# Patient Record
Sex: Female | Born: 1935 | Race: Black or African American | Hispanic: No | Smoking: Never smoker
Health system: Southern US, Community
[De-identification: ages and names within clinical notes are randomized; demographics above are authoritative.]

## PROBLEM LIST (undated history)

## (undated) DIAGNOSIS — T68XXXA Hypothermia, initial encounter: Secondary | ICD-10-CM

## (undated) DIAGNOSIS — R609 Edema, unspecified: Secondary | ICD-10-CM

## (undated) DIAGNOSIS — I10 Essential (primary) hypertension: Secondary | ICD-10-CM

## (undated) DIAGNOSIS — A0472 Enterocolitis due to Clostridium difficile, not specified as recurrent: Secondary | ICD-10-CM

## (undated) DIAGNOSIS — F0390 Unspecified dementia without behavioral disturbance: Secondary | ICD-10-CM

## (undated) HISTORY — PX: OTHER SURGICAL HISTORY: SHX169

---

## 2009-05-06 ENCOUNTER — Emergency Department: Payer: Self-pay | Admitting: Emergency Medicine

## 2011-08-06 LAB — CBC WITH DIFFERENTIAL/PLATELET
Basophil %: 0.3 %
Eosinophil #: 0.1 10*3/uL (ref 0.0–0.7)
Lymphocyte %: 10.2 %
MCH: 30 pg (ref 26.0–34.0)
MCV: 90 fL (ref 80–100)
Monocyte #: 1.4 x10 3/mm — ABNORMAL HIGH (ref 0.2–0.9)
Neutrophil #: 7.1 10*3/uL — ABNORMAL HIGH (ref 1.4–6.5)
Neutrophil %: 74.3 %
RDW: 13.4 % (ref 11.5–14.5)

## 2011-08-06 LAB — BASIC METABOLIC PANEL
Anion Gap: 10 (ref 7–16)
BUN: 24 mg/dL — ABNORMAL HIGH (ref 7–18)
Chloride: 95 mmol/L — ABNORMAL LOW (ref 98–107)
Creatinine: 1.26 mg/dL (ref 0.60–1.30)
EGFR (African American): 44 — ABNORMAL LOW
EGFR (Non-African Amer.): 38 — ABNORMAL LOW
Glucose: 83 mg/dL (ref 65–99)

## 2011-08-07 ENCOUNTER — Inpatient Hospital Stay: Payer: Self-pay | Admitting: Internal Medicine

## 2011-08-07 LAB — HEPATIC FUNCTION PANEL A (ARMC)
Bilirubin,Total: 0.3 mg/dL (ref 0.2–1.0)
SGPT (ALT): 20 U/L
Total Protein: 7.4 g/dL (ref 6.4–8.2)

## 2011-08-07 LAB — IRON AND TIBC
Iron Bind.Cap.(Total): 259 ug/dL (ref 250–450)
Iron Saturation: 20 %
Iron: 51 ug/dL (ref 50–170)

## 2011-08-07 LAB — URINALYSIS, COMPLETE
Bilirubin,UR: NEGATIVE
Blood: NEGATIVE
Leukocyte Esterase: NEGATIVE
Nitrite: NEGATIVE
Ph: 7 (ref 4.5–8.0)
Squamous Epithelial: <1
WBC UR: <1 /HPF (ref 0–5)

## 2011-08-07 LAB — FERRITIN: Ferritin (ARMC): 263 ng/mL (ref 8–388)

## 2011-08-07 LAB — TSH: Thyroid Stimulating Horm: 0.549 u[IU]/mL

## 2011-08-08 LAB — CBC WITH DIFFERENTIAL/PLATELET
Basophil #: 0 10*3/uL (ref 0.0–0.1)
Eosinophil %: 0.8 %
HCT: 30.1 % — ABNORMAL LOW (ref 35.0–47.0)
HGB: 10.6 g/dL — ABNORMAL LOW (ref 12.0–16.0)
Lymphocyte #: 1 10*3/uL (ref 1.0–3.6)
Lymphocyte %: 10.9 %
MCV: 88 fL (ref 80–100)
Monocyte %: 16.2 %
Neutrophil #: 6.8 10*3/uL — ABNORMAL HIGH (ref 1.4–6.5)
Neutrophil %: 71.8 %
RBC: 3.44 10*6/uL — ABNORMAL LOW (ref 3.80–5.20)
RDW: 13.7 % (ref 11.5–14.5)
WBC: 9.4 10*3/uL (ref 3.6–11.0)

## 2011-08-08 LAB — BASIC METABOLIC PANEL
Anion Gap: 9 (ref 7–16)
Calcium, Total: 9 mg/dL (ref 8.5–10.1)
Chloride: 92 mmol/L — ABNORMAL LOW (ref 98–107)
Co2: 26 mmol/L (ref 21–32)
EGFR (African American): 44 — ABNORMAL LOW
EGFR (Non-African Amer.): 38 — ABNORMAL LOW
Glucose: 85 mg/dL (ref 65–99)
Osmolality: 260 (ref 275–301)

## 2014-05-02 NOTE — H&P (Signed)
PATIENT NAME:  Connie Barr, Edan W MR#:  [id number] DATE OF BIRTH:  [birth year]  DATE OF ADMISSION:  08/07/2011  PRIMARY CARE PHYSICIAN: She has no local doctor. She sees somebody in [place].   CHIEF COMPLAINT: Increased peripheral edema, getting worse over the last few days, and some shortness of breath.   HISTORY OF PRESENT ILLNESS: Connie Barr is an 81 year old pleasant African American female. She was brought to the emergency department by her niece for evaluation of progressive increase in her lower extremity swelling. There was a question about increased shortness of breath that was reported; however, the patient denies for me. She states that her concern is about the leg swelling. She indicates that she has chronic leg swelling on and off, but it got worse. She denies any chest pain, no cough, no hemoptysis, no fever, and no abdominal pain. Evaluation in the emergency department revealed significant lower extremity edema along with finding of hyponatremia and anemia. The patient was admitted for further evaluation and management.   REVIEW OF SYSTEMS: CONSTITUTIONAL: Denies any fever. No chills. No fatigue. EYES: No blurring of vision. No double vision. ENT: No hearing impairment. No sore throat. No dysphagia. CARDIOVASCULAR: No chest pain. Questionable shortness of breath. No syncope. She has had progressive increase in leg edema for the last few days. RESPIRATORY: No chest pain. No hemoptysis. No cough. GASTROINTESTINAL: No abdominal pain. No vomiting. No diarrhea. GENITOURINARY: No dysuria. No frequency of urination. MUSCULOSKELETAL: No joint pain or swelling. No muscular pain or swelling. INTEGUMENTARY: No skin rash. No ulcers. NEUROLOGY: No focal weakness. No seizure activity. No headache. PSYCHIATRY: No anxiety. No depression. ENDOCRINE: No polyuria or polydipsia. No heat or cold intolerance.   PAST MEDICAL HISTORY: Reports hypertension and leg edema. This is the first presentation for this patient  to be admitted to this hospital and we have no records about her. She states that the last time she was hospitalized was many years ago and was for teeth extraction.  PAST SURGICAL HISTORY: Pelvic tumor that was benign, removed surgically in the 1960s. The patient does not have details about that.   FAMILY HISTORY: Both of her parents died at an old age. She does not have any specifics about that.   SOCIAL HABITS: Nonsmoker. No history of alcohol or drug abuse.   SOCIAL HISTORY: She is widowed, lives at home alone, and she has her niece who  takes care of her and looks after her.   ADMISSION MEDICATIONS: All that she knows is that she is on a fluid pill, a blue blood pressure pill, and aspirin 81 mg a day.   ALLERGIES: No known drug allergies, but reported that penicillin caused her to have some nausea and sometimes vomiting.   PHYSICAL EXAMINATION:   VITAL SIGNS: Blood pressure 204/91, respiratory rate 18, pulse 72, temperature 98.2, and pulse oximetry 100%. She is on oxygen now.   GENERAL APPEARANCE: Elderly female lying in bed in no acute distress.   HEAD AND NECK EXAMINATION: No pallor. No icterus. No cyanosis.  EARS, NOSE, AND THROAT: Hearing was normal. Nasal mucosa, lips, and tongue were normal.   EYES: Normal iris and conjunctivae, although conjunctivae is slightly pale. Pupils are about 3 to 4 mm. I could not detect reactivity to light with certainty.  NECK: Supple. Trachea at midline. No thyromegaly. No cervical lymphadenopathy. No masses.   HEART: Normal S1 and S2. No S3 or S4. No murmur. No gallop. No carotid bruits.   LUNGS: Normal breathing pattern without  use of accessory muscles. No rales. No wheezing.   ABDOMEN: Soft without tenderness. No hepatosplenomegaly. No masses. No hernias.   SKIN: No ulcers. No subcutaneous nodules.   EXTREMITIES: She has lower extremity edema, especially around the ankle and the dorsum of her feet. It is +3. No calf tenderness.    MUSCULOSKELETAL: No joint swelling. No clubbing.   NEUROLOGIC: Cranial nerves II through XII are intact. No focal motor deficit.   PSYCHIATRIC: The patient is alert and oriented to place and people. Her mood and affect were normal.   LABORATORY, DIAGNOSTIC AND RADIOLOGIC DATA: Chest x-ray showed heart size was normal. There is pulmonary vascular prominence with some supplization of pulmonary vessels. No consolidation. No effusion.   Bilateral lower extremity venous ultrasound was negative for deep vein thrombosis.   Serum glucose 83. B-type natriuretic peptide was elevated at 454. BUN 24, creatinine 1.2, sodium 128, and potassium 3.9. Estimated GFR 44. Liver function tests were normal. CBC showed white count of 9000, hemoglobin 10.3, hematocrit 30, and platelet count 232. MCV, MCH and MCHC were normal. D-dimer was more than 6.  Urinalysis was unremarkable.   ASSESSMENT:  1. Anasarca, in particular significant lower extremity edema. There also mild lymphedema as well.  2. Hyponatremia, most likely secondary to volume overload.  3. Normocytic, normochromic anemia.  4. Severe hypertension, uncontrolled.   PLAN: We will admit the patient to the medical floor and start intravenous diuresis using Lasix. Follow up on the sodium level and potassium as well. The potassium is in the low normal range. Therefore, I will give the patient some potassium supplementation since we are going to diurese her. I will add ACE inhibitor using lisinopril 10 mg a day for blood pressure control. We will obtain an echocardiogram to assess her left ventricular function and ejection fraction. We will do anemia work-up including iron TIBC, ferritin, B12, and folate. For deep vein thrombosis prophylaxis, I will place the patient on Lovenox. It will be a good idea at one-point to contact her pharmacy when it opens to have an idea about what medications she is taking. Regarding a Living Will, the patient states that she does  have a Living Will and she depends on her niece to be her caregiver, but it is unclear if she gave her legal power of attorney.   TIME SPENT EVALUATING THIS PATIENT: More than 55 minutes.  ____________________________ Carney Corners. Rudene Re, MD amd:slb D: 08/07/2011 06:30:25 ET T: 08/07/2011 09:39:17 ET JOB#: [id number]  cc: Carney Corners. Rudene Re, MD, <Dictator> Zollie Scale MD ELECTRONICALLY SIGNED 08/08/2011 22:34

## 2014-05-02 NOTE — Discharge Summary (Signed)
PATIENT NAME:  Connie Barr, Connie Barr MR#:  [id number] DATE OF BIRTH:  [birth year]  DATE OF ADMISSION:  08/07/2011 DATE OF DISCHARGE:  0726/2013  ADMITTING DIAGNOSIS: Lower extremity swelling.   DISCHARGE DIAGNOSES:  1. Lower extremity swelling likely due to combination of lymphedema as well as likely venous insufficiency. No evidence of congestive heart failure. Status post treatment with IV Lasix. Negative lower extremity Dopplers for deep vein thrombosis.  2. Hyponatremia, likely chronic in nature. Needs to have follow-up BMP as an outpatient  3. Hypertension.  4. History of pelvic tumor status post surgical resection.   PERTINENT LABORATORY, DIAGNOSTIC AND RADIOLOGIC DATA: Glucose 83. Basic metabolic panel 454. BUN 24, creatinine 1.26, sodium 128, potassium 3.9, chloride 95, and CO2 23. LFTs were normal. TSH 0.941. WBC 9.6, hemoglobin 10.3, and platelet count 232. D-dimer greater than 6.  Vitamin B12 level was elevated.   Bilateral lower extremity Doppler negative for deep venous thrombosis.   Chest x-ray showed possible underlying mild fibrosis.   CONSULTANTS: None.   HOSPITAL COURSE: Please refer to history and physical done by the admitting physician. The patient is an 81 year old pleasant African American female brought to the ED by her niece due to progressive increase in her lower extremity swelling. The patient otherwise reported that she was feeling fine. She was also noted to have some hyponatremia and anemia. We were asked to admit the patient. The patient had lower extremity Doppler's in the ED which were negative for deep vein thrombosis. There was no evidence of congestive heart failure. It is likely that she has chronic peripheral lymphedema as well as possible venous insufficiency. The patient was placed on IV Lasix with resolution of her swelling. She is doing much better and wants to go home. We will have PT evaluate the patient prior to discharge. If she does okay, discharge home.     DISCHARGE MEDICATIONS:  1. Hydrochlorothiazide 25 mg daily.  2. Aspirin 81 mg 1 tab p.o. daily. 3. Cosopt one drop to each affected eye twice a day. 4. Cetirizine 10 mg 1 tab p.o. daily.  5. The patient is given Lasix 40 mg p.r.n. for significant swelling.   HOME OXYGEN: None.   DIET: Regular.   ACTIVITY: As tolerated.   TIMEFRAME FOR FOLLOW UP: Followup in 1 to 2 weeks with [facility] primary care. Check a BMP at the time of visit to primary MD.   TIME SPENT: 35 minutes. ____________________________ Lacie ScottsShreyang H. Allena KatzPatel, MD shp:slb D: 08/08/2011 11:28:12 ET T: 08/08/2011 12:17:05 ET JOB#: [id number]  cc: Caellum Mancil H. Allena KatzPatel, MD, <Dictator>  Charise CarwinSHREYANG H Delainee Tramel MD ELECTRONICALLY SIGNED 08/12/2011 12:16

## 2014-06-01 ENCOUNTER — Inpatient Hospital Stay
Admission: EM | Admit: 2014-06-01 | Discharge: 2014-06-03 | DRG: 947 | Disposition: A | Discharge status: Skilled Nursing Facility | Payer: Medicare Other | Attending: Internal Medicine | Admitting: Internal Medicine

## 2014-06-01 ENCOUNTER — Encounter: Payer: Self-pay | Admitting: Emergency Medicine

## 2014-06-01 ENCOUNTER — Emergency Department: Payer: Medicare Other

## 2014-06-01 DIAGNOSIS — H409 Unspecified glaucoma: Secondary | ICD-10-CM | POA: Diagnosis present

## 2014-06-01 DIAGNOSIS — Z66 Do not resuscitate: Secondary | ICD-10-CM | POA: Diagnosis present

## 2014-06-01 DIAGNOSIS — N170 Acute kidney failure with tubular necrosis: Secondary | ICD-10-CM | POA: Diagnosis present

## 2014-06-01 DIAGNOSIS — R410 Disorientation, unspecified: Secondary | ICD-10-CM

## 2014-06-01 DIAGNOSIS — E039 Hypothyroidism, unspecified: Secondary | ICD-10-CM | POA: Diagnosis present

## 2014-06-01 DIAGNOSIS — F0390 Unspecified dementia without behavioral disturbance: Secondary | ICD-10-CM | POA: Diagnosis present

## 2014-06-01 DIAGNOSIS — T68XXXA Hypothermia, initial encounter: Secondary | ICD-10-CM

## 2014-06-01 DIAGNOSIS — R680 Hypothermia, not associated with low environmental temperature: Principal | ICD-10-CM | POA: Diagnosis present

## 2014-06-01 DIAGNOSIS — R4182 Altered mental status, unspecified: Secondary | ICD-10-CM | POA: Diagnosis not present

## 2014-06-01 DIAGNOSIS — I10 Essential (primary) hypertension: Secondary | ICD-10-CM | POA: Diagnosis present

## 2014-06-01 DIAGNOSIS — B349 Viral infection, unspecified: Secondary | ICD-10-CM | POA: Diagnosis present

## 2014-06-01 DIAGNOSIS — R601 Generalized edema: Secondary | ICD-10-CM

## 2014-06-01 HISTORY — DX: Edema, unspecified: R60.9

## 2014-06-01 HISTORY — DX: Essential (primary) hypertension: I10

## 2014-06-01 LAB — COMPREHENSIVE METABOLIC PANEL
ALT: 23 U/L (ref 14–54)
AST: 24 U/L (ref 15–41)
Albumin: 3.7 g/dL (ref 3.5–5.0)
Alkaline Phosphatase: 77 U/L (ref 38–126)
Anion gap: 6 (ref 5–15)
BILIRUBIN TOTAL: 0.3 mg/dL (ref 0.3–1.2)
BUN: 36 mg/dL — AB (ref 6–20)
CHLORIDE: 103 mmol/L (ref 101–111)
CO2: 26 mmol/L (ref 22–32)
Calcium: 9.3 mg/dL (ref 8.9–10.3)
Creatinine, Ser: 1.26 mg/dL — ABNORMAL HIGH (ref 0.44–1.00)
GFR calc non Af Amer: 36 mL/min — ABNORMAL LOW (ref >60)
GFR, EST AFRICAN AMERICAN: 42 mL/min — AB (ref >60)
Glucose, Bld: 79 mg/dL (ref 65–99)
Potassium: 4.2 mmol/L (ref 3.5–5.1)
Sodium: 135 mmol/L (ref 135–145)
Total Protein: 7.3 g/dL (ref 6.5–8.1)

## 2014-06-01 LAB — LACTIC ACID, PLASMA
Lactic Acid, Venous: 1.5 mmol/L (ref 0.5–2.0)
Lactic Acid, Venous: 0.6 mmol/L (ref 0.5–2.0)

## 2014-06-01 LAB — URINALYSIS COMPLETE WITH MICROSCOPIC (ARMC ONLY)
Bilirubin Urine: NEGATIVE
Glucose, UA: NEGATIVE mg/dL
Hgb urine dipstick: NEGATIVE
Ketones, ur: NEGATIVE mg/dL
Nitrite: NEGATIVE
Protein, ur: NEGATIVE mg/dL
RBC / HPF: NONE SEEN RBC/hpf (ref 0–5)
Specific Gravity, Urine: 1.01 (ref 1.005–1.030)
pH: 6 (ref 5.0–8.0)

## 2014-06-01 LAB — CBC
HCT: 31.2 % — ABNORMAL LOW (ref 35.0–47.0)
HEMATOCRIT: 31.9 % — AB (ref 35.0–47.0)
Hemoglobin: 10.4 g/dL — ABNORMAL LOW (ref 12.0–16.0)
Hemoglobin: 10.6 g/dL — ABNORMAL LOW (ref 12.0–16.0)
MCH: 30.5 pg (ref 26.0–34.0)
MCH: 30.6 pg (ref 26.0–34.0)
MCHC: 33.2 g/dL (ref 32.0–36.0)
MCHC: 33.3 g/dL (ref 32.0–36.0)
MCV: 92 fL (ref 80.0–100.0)
MCV: 91.9 fL (ref 80.0–100.0)
PLATELETS: 200 10*3/uL (ref 150–440)
Platelets: 219 10*3/uL (ref 150–440)
RBC: 3.39 MIL/uL — ABNORMAL LOW (ref 3.80–5.20)
RBC: 3.47 MIL/uL — ABNORMAL LOW (ref 3.80–5.20)
RDW: 15.4 % — ABNORMAL HIGH (ref 11.5–14.5)
RDW: 15.2 % — AB (ref 11.5–14.5)
WBC: 8 10*3/uL (ref 3.6–11.0)
WBC: 6.3 10*3/uL (ref 3.6–11.0)

## 2014-06-01 LAB — CREATININE, SERUM
Creatinine, Ser: 1.19 mg/dL — ABNORMAL HIGH (ref 0.44–1.00)
GFR calc Af Amer: 45 mL/min — ABNORMAL LOW (ref >60)
GFR calc non Af Amer: 39 mL/min — ABNORMAL LOW (ref >60)

## 2014-06-01 LAB — PROTIME-INR
INR: 1
Prothrombin Time: 13.4 seconds (ref 11.4–15.0)

## 2014-06-01 LAB — TROPONIN I: Troponin I: <0.03 ng/mL

## 2014-06-01 LAB — TSH: TSH: 1.714 u[IU]/mL (ref 0.350–4.500)

## 2014-06-01 LAB — BRAIN NATRIURETIC PEPTIDE: B Natriuretic Peptide: 111 pg/mL — ABNORMAL HIGH (ref 0.0–100.0)

## 2014-06-01 LAB — APTT: aPTT: 31 s (ref 24–36)

## 2014-06-01 MED ORDER — ACETAMINOPHEN 500 MG PO TABS: 500.0000 mg | ORAL_TABLET | Freq: Four times a day (QID) | ORAL | Status: DC | PRN

## 2014-06-01 MED ORDER — SODIUM CHLORIDE 0.9 % IV SOLN
INTRAVENOUS | Status: DC
  Administered 2014-06-02 (×3): via INTRAVENOUS

## 2014-06-01 MED ORDER — FUROSEMIDE 10 MG/ML IJ SOLN
20.0000 mg | Freq: Once | INTRAMUSCULAR | Status: AC
  Administered 2014-06-01: 20 mg via INTRAVENOUS

## 2014-06-01 MED ORDER — VANCOMYCIN HCL IN DEXTROSE 1-5 GM/200ML-% IV SOLN
INTRAVENOUS | Status: AC
  Administered 2014-06-01: 1000 mg via INTRAVENOUS
  Filled 2014-06-01: qty 200

## 2014-06-01 MED ORDER — PIPERACILLIN-TAZOBACTAM 3.375 G IVPB
INTRAVENOUS | Status: AC
  Filled 2014-06-01: qty 50

## 2014-06-01 MED ORDER — QUETIAPINE FUMARATE 25 MG PO TABS
25.0000 mg | ORAL_TABLET | Freq: Two times a day (BID) | ORAL | Status: DC
  Administered 2014-06-01 – 2014-06-03 (×4): 25 mg via ORAL
  Filled 2014-06-01 (×4): qty 1

## 2014-06-01 MED ORDER — PIPERACILLIN-TAZOBACTAM 3.375 G IVPB
3.3750 g | Freq: Once | INTRAVENOUS | Status: AC
  Administered 2014-06-01: 3.375 g via INTRAVENOUS

## 2014-06-01 MED ORDER — CEFTRIAXONE SODIUM IN DEXTROSE 20 MG/ML IV SOLN
1.0000 g | INTRAVENOUS | Status: DC
  Administered 2014-06-01 – 2014-06-02 (×2): 1 g via INTRAVENOUS
  Filled 2014-06-01 (×3): qty 50

## 2014-06-01 MED ORDER — ENOXAPARIN SODIUM 40 MG/0.4ML ~~LOC~~ SOLN
40.0000 mg | SUBCUTANEOUS | Status: DC
  Administered 2014-06-01 – 2014-06-02 (×2): 40 mg via SUBCUTANEOUS
  Filled 2014-06-01 (×2): qty 0.4

## 2014-06-01 MED ORDER — DORZOLAMIDE HCL-TIMOLOL MAL 2-0.5 % OP SOLN
1.0000 [drp] | Freq: Two times a day (BID) | OPHTHALMIC | Status: DC
  Administered 2014-06-01 – 2014-06-03 (×4): 1 [drp] via OPHTHALMIC
  Filled 2014-06-01: qty 10

## 2014-06-01 MED ORDER — FUROSEMIDE 10 MG/ML IJ SOLN
INTRAMUSCULAR | Status: AC
  Filled 2014-06-01: qty 4

## 2014-06-01 MED ORDER — VANCOMYCIN HCL IN DEXTROSE 1-5 GM/200ML-% IV SOLN
1000.0000 mg | Freq: Once | INTRAVENOUS | Status: AC
  Administered 2014-06-01: 1000 mg via INTRAVENOUS

## 2014-06-01 NOTE — ED Notes (Signed)
[facility] home via [facility], pt confused, anxious, family concerned about living status, VSS, no pain, NAD

## 2014-06-01 NOTE — ED Notes (Signed)
Bair Hugger applied

## 2014-06-01 NOTE — H&P (Signed)
[facility] [place] [facility]   PATIENT NAME: Connie Barr    MR#:  [id number]  DATE OF BIRTH:  11/14/1922  DATE OF ADMISSION:  06/01/2014  PRIMARY CARE PHYSICIAN: Arlyss Queen, MD   REQUESTING/REFERRING PHYSICIAN: Dr. Loleta Rose  CHIEF COMPLAINT:   Chief Complaint  Patient presents with  . Altered Mental Status    HISTORY OF PRESENT ILLNESS:  Connie Barr  is a 81 y.o. female with a known history of hypertension, lower extremity lymphedema, dementia comes in with altered mental status. Patient progressed the life alert EMS arrived temperature was 93.8 Fahrenheit when she arrived. According to the family she is been having sundowning and then confused lately. And locking herself in the bathroom. He thinks that changes are coming to the house and trying to straighten her and the heart had social posterior bathroom and locks herself up and today she called ambulance when EMS arrived and he was hypothermic. Her vitals are stable. According to the family she is been having cough for the past 3 days associated with green phlegm. No fever. Patient is going to be admitted for hypothermia for possible impending sepsis. According to the family she stays cold all the time and also in even somewhat of the heaters run some. Patient family is requesting placement secondary to her sundowning and the fact that she lives alone.  PAST MEDICAL HISTORY:   Past Medical History  Diagnosis Date  . Hypertension   . Peripheral edema     PAST SURGICAL HISTOIRY:   Past Surgical History  Procedure Laterality Date  . Pelvic tumor      removed in the 1960s    SOCIAL HISTORY:   History  Substance Use Topics  . Smoking status: Never Smoker   . Smokeless tobacco: Not on file  . Alcohol Use: No    FAMILY HISTORY:  History reviewed. No pertinent family history.  DRUG ALLERGIES:  No Known Allergies  REVIEW OF SYSTEMS:  CONSTITUTIONAL: No fever, fatigue or  weakness.  EYES: No blurred or double vision.  EARS, NOSE, AND THROAT: No tinnitus or ear pain.  RESPIRATORY: No cough, shortness of breath, wheezing or hemoptysis.  CARDIOVASCULAR: No chest pain, orthopnea, edema.  GASTROINTESTINAL: No nausea, vomiting, diarrhea or abdominal pain.  GENITOURINARY: No dysuria, hematuria.  ENDOCRINE: No polyuria, nocturia,  HEMATOLOGY: No anemia, easy bruising or bleeding SKIN: No rash or lesion. MUSCULOSKELETAL: No joint pain or arthritis.   NEUROLOGIC: No tingling, numbness, weakness.  PSYCHIATRY: No anxiety or depression.   MEDICATIONS AT HOME:   Prior to Admission medications   Medication Sig Start Date End Date Taking? Authorizing Provider  acetaminophen (TYLENOL) 500 MG tablet Take 500-1,000 mg by mouth every 6 (six) hours as needed for mild pain or headache.   Yes Historical Provider, MD  dorzolamide-timolol (COSOPT) 22.3-6.8 MG/ML ophthalmic solution Place 1 drop into both eyes 2 (two) times daily.   Yes Historical Provider, MD  enalapril (VASOTEC) 20 MG tablet Take 20 mg by mouth daily.   Yes Historical Provider, MD  Multiple Vitamins-Minerals (CENTRUM SILVER PO) Take 1 tablet by mouth daily.   Yes Historical Provider, MD  QUEtiapine (SEROQUEL) 25 MG tablet Take 50 mg by mouth 2 (two) times daily.   Yes Historical Provider, MD      VITAL SIGNS:  Blood pressure 165/80, pulse 67, temperature 93.8 F (34.3 C), temperature source Rectal, resp. rate 17, SpO2 99 %.  PHYSICAL EXAMINATION:  GENERAL:  81 y.o.-year-old patient lying  in the bed with no acute distress.  EYES: Pupils equal, round, reactive to light and accommodation. No scleral icterus. Extraocular muscles intact.  HEENT: Head atraumatic, normocephalic. Oropharynx and nasopharynx clear.  NECK:  Supple, no jugular venous distention. No thyroid enlargement, no tenderness.  LUNGS: Normal breath sounds bilaterally, no wheezing, rales,rhonchi or crepitation. No use of accessory muscles of  respiration.  CARDIOVASCULAR: S1, S2 normal. No murmurs, rubs, or gallops.  ABDOMEN: Soft, nontender, nondistended. Bowel sounds present. No organomegaly or mass.  EXTREMITIES: 3+ pitting edema up to the knees bilaterally. Patient does have chronic lymphedema. No tenderness no redness. NEUROLOGIC: Cranial nerves II through XII are intact. Muscle strength 5/5 in all extremities. Sensation intact. Gait not checked.  PSYCHIATRIC: The patient is alert and oriented x 3. Slightly confused SKIN: No obvious rash, lesion, or ulcer.   LABORATORY PANEL:   CBC  Recent Labs Lab 06/01/14 1445  WBC 8.0  HGB 10.4*  HCT 31.2*  PLT 219   ------------------------------------------------------------------------------------------------------------------  Chemistries   Recent Labs Lab 06/01/14 1445  NA 135  K 4.2  CL 103  CO2 26  GLUCOSE 79  BUN 36*  CREATININE 1.26*  CALCIUM 9.3  AST 24  ALT 23  ALKPHOS 77  BILITOT 0.3   ------------------------------------------------------------------------------------------------------------------  Cardiac Enzymes  Recent Labs Lab 06/01/14 1445  TROPONINI <0.03   ------------------------------------------------------------------------------------------------------------------  RADIOLOGY:  Ct Head Wo Contrast  06/01/2014   CLINICAL DATA:  Altered mental status  EXAM: CT HEAD WITHOUT CONTRAST  TECHNIQUE: Contiguous axial images were obtained from the base of the skull through the vertex without intravenous contrast.  COMPARISON:  05/06/2009  FINDINGS: There is no evidence of mass effect, midline shift, or extra-axial fluid collections. There is no evidence of a space-occupying lesion or intracranial hemorrhage. There is no evidence of a cortical-based area of acute infarction. There is generalized cerebral atrophy. There is periventricular white matter low attenuation likely secondary to microangiopathy.  The ventricles and sulci are appropriate for  the patient's age. The basal cisterns are patent.  Visualized portions of the orbits are unremarkable. The visualized portions of the paranasal sinuses and mastoid air cells are unremarkable. Cerebrovascular atherosclerotic calcifications are noted.  The osseous structures are unremarkable.  IMPRESSION: 1. No acute intracranial pathology. 2. Chronic microvascular disease and cerebral atrophy.   Electronically Signed   By: Elige KoHetal  Patel   On: 06/01/2014 15:55   Dg Chest Port 1 View  06/01/2014   CLINICAL DATA:  Altered mental status.  Chest pain.  EXAM: PORTABLE CHEST - 1 VIEW  COMPARISON:  08/06/2011  FINDINGS: Patient is rotated towards the left. There is mild linear airspace disease at the left lung base likely reflecting discoid atelectasis. There is no focal parenchymal opacity. There is no pleural effusion or pneumothorax. The heart mediastinum are stable.  There is severe osteoarthritis of the left glenohumeral joint.  IMPRESSION: No active disease.   Electronically Signed   By: Elige KoHetal  Patel   On: 06/01/2014 15:40    EKG:   Orders placed or performed during the hospital encounter of 06/01/14  . ED EKG  . ED EKG    IMPRESSION AND PLAN:   1 hypothermia in demented patient: Rule out sepsis: Continue patient on bair hugger.  check temperature to 1 hour, continue IV fluids. Follow blood cultures, urine cultures. Patient is a bank and Zosyn the emergency room. Chest x-ray was concerning for some atelectasis, she is having some cough and green phlegm so we will empirically  cover with antibiotics Rocephin 1 g IV daily. Check TSH level  Hypothyroidism.  #2 patient to confusion and sundowning: She lives alone family requesting placement, physical therapy evaluation and social worker evaluation.  3.The dementia ;pt has been started by primary doctor a week ago. Continue Seroquel 50 MG twice a day.  4. Mild acute renal failure likely ATN: continue IV fluids for today, hold enalapril  5. Altered  mental status likely secondary to underlying dementia rather than due to hypothermia. Patient CT head unremarkable. Glaucoma continue eyedrops. CODE STATUS DO NOT RESUSCITATE    All the records are reviewed and case discussed with ED provider. Management plans discussed with the patient, family and they are in agreement.  CODE STATUS:   DO NOT RESUSCITATE TOTAL TIME TAKING CARE OF THIS PATIENT: 55 minutes.    Katha HammingKONIDENA,Edie Darley M.D on 06/01/2014 at 6:14 PM  Between 7am to 6pm - Pager - [phone]  After 6pm go to [email or web address] - password [email or web address] [facility]  [facility]  Office  [phone]  CC: Primary care physician; Arlyss QueenSELVIDGE,WILLIAM M, MD

## 2014-06-01 NOTE — ED Provider Notes (Signed)
[facility] Emergency Department Provider Note  ____________________________________________  Time seen: Approximately 3:07 PM  I have reviewed the triage vital signs and the nursing notes.   HISTORY  Chief Complaint Altered Mental Status  Altered mental status versus chronic dementia limits the history, which was obtained primarily from past medical records and from family  HPI Durwin NoraMartha W Greulich is a 81 y.o. female with unknown medical history presents by EMS for altered mental status.  The patient is confused and unable to provide history but is in no acute distress.  The family is reportedly here but not available during my examination.  The patient lives at home apparently by herself.  The patient's past medical and social history was obtained from an admission H&P and discharge summary from this hospital in 2013.She was admitted at that time for peripheral edema anasarca and hyponatremia.  Past Medical History  Diagnosis Date  . Hypertension   . Peripheral edema    There are no active problems to display for this patient.   Past Surgical History  Procedure Laterality Date  . Pelvic tumor      removed in the 1960s    No current outpatient prescriptions on file.  Allergies Review of patient's allergies indicates not on file.  History reviewed. No pertinent family history.  Social History History  Substance Use Topics  . Smoking status: Never Smoker   . Smokeless tobacco: Not on file  . Alcohol Use: No   Review of Systems  A reliable review of systems is not able to be obtained due to the patient's confusion/altered mental status, during my evaluation she denied nausea, vomiting, chest pain, shortness of breath, abdominal pain, fever, and chills. ____________________________________________   PHYSICAL EXAM:  VITAL SIGNS: ED Triage Vitals  Enc Vitals Group     BP 06/01/14 1413 171/76 mmHg     Pulse Rate 06/01/14 1413 61     Resp 06/01/14  1413 16     Temp 06/01/14 1423 93.8 F (34.3 C)     Temp Source 06/01/14 1423 Rectal     SpO2 06/01/14 1413 100 %     Weight --      Height --      Head Cir --      Peak Flow --      Pain Score --      Pain Loc --      Pain Edu? --      Excl. in GC? --     Constitutional: Awake and alert, oriented to self and to the fact she is at the hospital.  Elderly but in no acute distress and is nontoxic appearing at this time Eyes: Conjunctivae are normal. PERRL. EOMI. Head: Atraumatic. Nose: No congestion/rhinnorhea. Mouth/Throat: Mucous membranes are moist.  Oropharynx non-erythematous. Neck: No stridor.  No cervical spine tenderness to palpation. Cardiovascular: Normal rate, regular rhythm. Grossly normal heart sounds.  Good peripheral circulation. Respiratory: Normal respiratory effort.  No retractions. Lungs CTAB. Gastrointestinal: Obese, soft and nontender. No distention. No abdominal bruits. No CVA tenderness. Musculoskeletal: 2+ bilateral lower extremity pitting edema.  Appropriately warm, no evidence of cellulitis.  No joint effusions. Neurologic:  Normal speech and language. No gross focal neurologic deficits are appreciated. Speech is normal.  Skin:  Skin is warm, dry and intact. No rash noted. Psychiatric: Speech and behavior are normal, oriented x 2  ____________________________________________   LABS (all labs ordered are listed, but only abnormal results are displayed)  Labs Reviewed  CBC -  Abnormal; Notable for the following:    RBC 3.39 (*)    Hemoglobin 10.4 (*)    HCT 31.2 (*)    RDW 15.4 (*)    All other components within normal limits  COMPREHENSIVE METABOLIC PANEL - Abnormal; Notable for the following:    BUN 36 (*)    Creatinine, Ser 1.26 (*)    GFR calc non Af Amer 36 (*)    GFR calc Af Amer 42 (*)    All other components within normal limits  BRAIN NATRIURETIC PEPTIDE - Abnormal; Notable for the following:    B Natriuretic Peptide 111.0 (*)    All other  components within normal limits  URINALYSIS COMPLETEWITH MICROSCOPIC ([facility])  - Abnormal; Notable for the following:    Color, Urine YELLOW (*)    APPearance CLOUDY (*)    Leukocytes, UA TRACE (*)    Bacteria, UA MANY (*)    Squamous Epithelial / LPF 6-30 (*)    All other components within normal limits  CULTURE, BLOOD (ROUTINE X 2)  CULTURE, BLOOD (ROUTINE X 2)  URINE CULTURE  APTT  PROTIME-INR  LACTIC ACID, PLASMA  TROPONIN I  LACTIC ACID, PLASMA   Urinalysis unremarkable ____________________________________________  EKG  ED ECG REPORT I, Gao Mitnick, the attending physician, personally viewed and interpreted this ECG.   Date: 06/01/2014  EKG Time: 14:24  Rate: 61  Rhythm: normal sinus rhythm, 1st degree AV block  Axis: Normal  Intervals:right bundle branch block  ST&T Change: Non-specific ST segment / T-wave changes, but no evidence of acute ischemia.  ____________________________________________  RADIOLOGY  Ct Head Wo Contrast  06/01/2014   CLINICAL DATA:  Altered mental status  EXAM: CT HEAD WITHOUT CONTRAST  TECHNIQUE: Contiguous axial images were obtained from the base of the skull through the vertex without intravenous contrast.  COMPARISON:  05/06/2009  FINDINGS: There is no evidence of mass effect, midline shift, or extra-axial fluid collections. There is no evidence of a space-occupying lesion or intracranial hemorrhage. There is no evidence of a cortical-based area of acute infarction. There is generalized cerebral atrophy. There is periventricular white matter low attenuation likely secondary to microangiopathy.  The ventricles and sulci are appropriate for the patient's age. The basal cisterns are patent.  Visualized portions of the orbits are unremarkable. The visualized portions of the paranasal sinuses and mastoid air cells are unremarkable. Cerebrovascular atherosclerotic calcifications are noted.  The osseous structures are unremarkable.  IMPRESSION: 1. No  acute intracranial pathology. 2. Chronic microvascular disease and cerebral atrophy.   Electronically Signed   By: Elige Ko   On: 06/01/2014 15:55   Dg Chest Port 1 View  06/01/2014   CLINICAL DATA:  Altered mental status.  Chest pain.  EXAM: PORTABLE CHEST - 1 VIEW  COMPARISON:  08/06/2011  FINDINGS: Patient is rotated towards the left. There is mild linear airspace disease at the left lung base likely reflecting discoid atelectasis. There is no focal parenchymal opacity. There is no pleural effusion or pneumothorax. The heart mediastinum are stable.  There is severe osteoarthritis of the left glenohumeral joint.  IMPRESSION: No active disease.   Electronically Signed   By: Elige Ko   On: 06/01/2014 15:40    ____________________________________________    INITIAL IMPRESSION / ASSESSMENT AND PLAN / ED COURSE  Pertinent labs & imaging results that were available during my care of the patient were reviewed by me and considered in my medical decision making (see chart for details).  3:26 PM  The patient's initial vitals are notable for hypothermia at about 93.  A bair hugger was started and a septic workup initiated.  So far the CBC, UA, and metabolic panel are unremarkable.  I am awaiting a lactic acid, troponin, BNP, CT head, and chest x-ray.  She has significant lower extremity edema but it is unknown if this is chronic for her.  She appears to be more awake at this time after being on the warmer.  I will continue to evaluate.  ----------------------------------------- 4:46 PM on 06/01/2014 -----------------------------------------  The patient's nieces are now present.  They report that she has had waxing and waning mental status recently but is also demonstrated a slow decline over a period of time (delirium on top of developing dementia?).  Her lower extremity edema is quite bad, similar to her presentation 2013.  Her altered mental status was significantly worse this morning when  she was hypothermic with no apparent reason.  Given her age, altered mental status, hypothermia, and anasarca, believe she would benefit from inpatient admission for a sepsis rule out an empiric antibiotics until the blood cultures come back negative.  I am also giving her Lasix 20 mg IV for her anasarca, which helped significantly in 2013 for her anasarca admission at that time.   She will likely also benefit from an inpatient PT/OT consultation and may eventually need placement and a skilled nursing facility based on their evaluation.  I discussed this plan with the hospitalist in person and they agree with the plan.    ____________________________________________  FINAL CLINICAL IMPRESSION(S) / ED DIAGNOSES  Final diagnoses:  Delirium  Hypothermia, initial encounter  Anasarca     Loleta Roseory Boneta Standre, MD 06/01/14 1654

## 2014-06-02 NOTE — Care Management (Signed)
Patient resides at home alone.  She has meals on Wheels and Life alert.  Her two nieces Blenda BridegroomLinda Smith- [phone] and Theresia LoLena McGhee [phone] are patient's HCPOA.  Will bring documents.  Patient has dementia and it has been progressing to the point that patient "probably should not be living alone."  She requires the assistance of her nieces for bathing and meals that can be put in the microwave.    She presented with altered mental status and hypothermia requiring bear hugger.  Sodium was low.   Physical therapy has recommended SNF and patient's nieces are in agreement.  Facility preference is  [facility].  CSW is aware

## 2014-06-02 NOTE — Evaluation (Signed)
Physical Therapy Evaluation Patient Details Name: Durwin NoraMartha W Segal MRN: [id number] DOB: 11-20-1922 Today's Date: 06/02/2014   History of Present Illness  81 yo female with onset of  hypothermia due to sepsis emerging, wiht weakness and dramatic LE edema developing.  CT negative but did note chronic cerebral atrophy, mild acute renal failure.  PMHx:  HTN, lymphadema, dementia.  Clinical Impression  Pt was seen for evaluation of mobility and anticipate her destination to be SNF.  Has a great start to PT being up in chair and inserviced nursing to assist back to bed.    Follow Up Recommendations SNF    Equipment Recommendations  None recommended by PT    Recommendations for Other Services       Precautions / Restrictions Precautions Precautions: Fall Precaution Comments: telemetry Restrictions Weight Bearing Restrictions: No      Mobility  Bed Mobility Overal bed mobility: Needs Assistance;+2 for physical assistance;+ 2 for safety/equipment Bed Mobility: Supine to Sit     Supine to sit: Mod assist;+2 for physical assistance;+2 for safety/equipment;HOB elevated     General bed mobility comments: cues and assist for hips and trunk  Transfers Overall transfer level: Needs assistance Equipment used: Rolling walker (2 wheeled);2 person hand held assist Transfers: Sit to/from [employer]Stand;Stand Pivot Transfers Sit to Stand: Mod assist;+2 physical assistance;+2 safety/equipment Stand pivot transfers: Mod assist;+2 physical assistance;+2 safety/equipment          Ambulation/Gait             General Gait Details: sidestep to chair only  Stairs            Wheelchair Mobility    Modified Rankin (Stroke Patients Only)       Balance Overall balance assessment: Needs assistance Sitting-balance support: Feet supported Sitting balance-Leahy Scale: Poor   Postural control: Posterior lean Standing balance support: Bilateral upper extremity supported Standing  balance-Leahy Scale: Poor                               Pertinent Vitals/Pain Pain Assessment: No/denies pain    Home Living Family/patient expects to be discharged to:: Skilled nursing facility Living Arrangements: Alone                    Prior Function Level of Independence: Independent with assistive device(s)         Comments: Has been assisted by her family     Hand Dominance        Extremity/Trunk Assessment   Upper Extremity Assessment: Generalized weakness           Lower Extremity Assessment: Generalized weakness      Cervical / Trunk Assessment: Kyphotic  Communication   Communication: Expressive difficulties;Other (comment) (slightly slurred speech)  Cognition Arousal/Alertness: Lethargic Behavior During Therapy: WFL for tasks assessed/performed Overall Cognitive Status: History of cognitive impairments - at baseline       Memory: Decreased short-term memory;Decreased recall of precautions              General Comments General comments (skin integrity, edema, etc.): LE edema is substantial and elevated legs in chair once up with compression devices on by MD    Exercises        Assessment/Plan    PT Assessment Patient needs continued PT services  PT Diagnosis Difficulty walking;Generalized weakness   PT Problem List Decreased strength;Decreased range of motion;Decreased activity tolerance;Decreased balance;Decreased mobility;Decreased cognition;Decreased coordination;Decreased knowledge of use of  DME;Decreased safety awareness;Cardiopulmonary status limiting activity;Decreased knowledge of precautions;Obesity;Pain  PT Treatment Interventions DME instruction;Gait training;Functional mobility training;Therapeutic activities;Therapeutic exercise;Balance training;Neuromuscular re-education;Patient/family education;Cognitive remediation   PT Goals (Current goals can be found in the Care Plan section) Acute Rehab PT  Goals Patient Stated Goal: none stated PT Goal Formulation: With patient Time For Goal Achievement: 06/16/14 Potential to Achieve Goals: Good    Frequency Min 2X/week   Barriers to discharge Inaccessible home environment;Decreased caregiver support      Co-evaluation               End of Session Equipment Utilized During Treatment: Gait belt Activity Tolerance: Patient tolerated treatment well;Patient limited by fatigue;Patient limited by lethargy;Other (comment) (wakness and LE edema) Patient left: in chair;with call bell/phone within reach;with chair alarm set;with nursing/sitter in room Nurse Communication: Mobility status         Time: 1132-1203 PT Time Calculation (min) (ACUTE ONLY): 31 min   Charges:   PT Evaluation $Initial PT Evaluation Tier I: 1 Procedure PT Treatments $Therapeutic Activity: 8-22 mins   PT G Codes:        Ivar DrapeStout, Colette Dicamillo E 06/02/2014, 1:35 PM   Samul Dadauth Nashaun Hillmer, PT MS Acute Rehab Dept. Number: [facility] [phone] and [facility] [phone]

## 2014-06-02 NOTE — Progress Notes (Signed)
[facility] [place] [facility]   PATIENT NAME: Connie ShepherdMartha Barr    MR#:  [id number]  DATE OF BIRTH:  03/30/1922  SUBJECTIVE:  CHIEF COMPLAINT:   Patient is more awake and alert but very hard of hearing. Feeling weak. No other complaints no family members at bedside  REVIEW OF SYSTEMS:  CONSTITUTIONAL: No fever, fatigue ,  reportingweakness.  EYES: No blurred or double vision.  EARS, NOSE, AND THROAT: No tinnitus or ear pain.  RESPIRATORY: No cough, shortness of breath, wheezing or hemoptysis.  CARDIOVASCULAR: No chest pain, orthopnea, edema.  GASTROINTESTINAL: No nausea, vomiting, diarrhea or abdominal pain.  GENITOURINARY: No dysuria, hematuria.  ENDOCRINE: No polyuria, nocturia,  HEMATOLOGY: No anemia, easy bruising or bleeding SKIN: No rash or lesion. MUSCULOSKELETAL: No joint pain or arthritis.   NEUROLOGIC: No tingling, numbness, weakness.  PSYCHIATRY: No anxiety or depression.   DRUG ALLERGIES:  No Known Allergies  VITALS:  Blood pressure 152/65, pulse 82, temperature 98.4 F (36.9 C), temperature source Oral, resp. rate 18, height 5\' 3"  (1.6 m), weight 85.276 kg (188 lb), SpO2 100 %.  PHYSICAL EXAMINATION:  GENERAL:  81 y.o.-year-old patient lying in the bed with no acute distress.  EYES: Pupils equal, round, reactive to light and accommodation. No scleral icterus. Extraocular muscles intact.  HEENT: Head atraumatic, normocephalic. Oropharynx and nasopharynx clear.  Hard of hearing NECK:  Supple, no jugular venous distention. No thyroid enlargement, no tenderness.  LUNGS: Normal breath sounds bilaterally, no wheezing, rales,rhonchi or crepitation. No use of accessory muscles of respiration.  CARDIOVASCULAR: S1, S2 normal. No murmurs, rubs, or gallops.  ABDOMEN: Soft, nontender, nondistended. Bowel sounds present. No organomegaly or mass.  EXTREMITIES: No pedal edema, cyanosis, or clubbing.  NEUROLOGIC: Cranial nerves II through XII are intact.  Muscle strength 5/5 in all extremities. Sensation intact. Gait not checked.  PSYCHIATRIC: The patient is alert and oriented x 3.  SKIN: No obvious rash, lesion, or ulcer.    LABORATORY PANEL:   CBC  Recent Labs Lab 06/01/14 2118  WBC 6.3  HGB 10.6*  HCT 31.9*  PLT 200   ------------------------------------------------------------------------------------------------------------------  Chemistries   Recent Labs Lab 06/01/14 1445 06/01/14 2118  NA 135  --   K 4.2  --   CL 103  --   CO2 26  --   GLUCOSE 79  --   BUN 36*  --   CREATININE 1.26* 1.19*  CALCIUM 9.3  --   AST 24  --   ALT 23  --   ALKPHOS 77  --   BILITOT 0.3  --    ------------------------------------------------------------------------------------------------------------------  Cardiac Enzymes  Recent Labs Lab 06/01/14 1445  TROPONINI <0.03   ------------------------------------------------------------------------------------------------------------------  RADIOLOGY:  Ct Head Wo Contrast  06/01/2014   CLINICAL DATA:  Altered mental status  EXAM: CT HEAD WITHOUT CONTRAST  TECHNIQUE: Contiguous axial images were obtained from the base of the skull through the vertex without intravenous contrast.  COMPARISON:  05/06/2009  FINDINGS: There is no evidence of mass effect, midline shift, or extra-axial fluid collections. There is no evidence of a space-occupying lesion or intracranial hemorrhage. There is no evidence of a cortical-based area of acute infarction. There is generalized cerebral atrophy. There is periventricular white matter low attenuation likely secondary to microangiopathy.  The ventricles and sulci are appropriate for the patient's age. The basal cisterns are patent.  Visualized portions of the orbits are unremarkable. The visualized portions of the paranasal sinuses and mastoid air cells are  unremarkable. Cerebrovascular atherosclerotic calcifications are noted.  The osseous structures are  unremarkable.  IMPRESSION: 1. No acute intracranial pathology. 2. Chronic microvascular disease and cerebral atrophy.   Electronically Signed   By: Elige KoHetal  Patel   On: 06/01/2014 15:55   Dg Chest Port 1 View  06/01/2014   CLINICAL DATA:  Altered mental status.  Chest pain.  EXAM: PORTABLE CHEST - 1 VIEW  COMPARISON:  08/06/2011  FINDINGS: Patient is rotated towards the left. There is mild linear airspace disease at the left lung base likely reflecting discoid atelectasis. There is no focal parenchymal opacity. There is no pleural effusion or pneumothorax. The heart mediastinum are stable.  There is severe osteoarthritis of the left glenohumeral joint.  IMPRESSION: No active disease.   Electronically Signed   By: Elige KoHetal  Patel   On: 06/01/2014 15:40    EKG:   Orders placed or performed during the hospital encounter of 06/01/14  . ED EKG  . ED EKG  . EKG 12-Lead  . EKG 12-Lead    ASSESSMENT AND PLAN:   * hypothermia in demented patient: Rule out sepsis: Improved with bair hugger.  continue IV fluids. Follow blood cultures, urine cultures. Patient is started  on empiric antibiotics IV vancomycin and Zosyn in the emergency department. Chest x-ray was concerning for some atelectasis, she is having some cough and green phlegm so we will empirically cover with antibiotics Rocephin 1 g IV daily.  *Hypothyroidism.  Normal TSH level    * dementia ;. Continue Seroquel 50 MG twice a day started by primary care physician a week ago  * Mild acute renal failure likely ATN: Clinically improving, check BMP in a.m., continue IV fluids for today, hold enalapril  *. Altered mental status likely secondary to underlying dementia rather than due to hypothermia. Patient CT head unremarkable.  *Glaucoma continue eyedrops.   * Generalized weakness -She lives alone family requesting placement, physical therapy evaluation and social worker evaluation.  CODE STATUS DO NOT RESUSCITATE     All the records are  reviewed and case discussed with Care Management/Social Workerr. Management plans discussed with the patient, family and they are in agreement.  CODE STATUS: DO NOT RESUSCITATE  TOTAL TIME TAKING CARE OF THIS PATIENT: 35 minutes.   POSSIBLE D/C IN 1-2 DAYS, DEPENDING ON CLINICAL CONDITION.   Ramonita LabGouru, Shayden Gingrich M.D on 06/02/2014 at 3:05 PM  Between 7am to 6pm - Pager - [phone] After 6pm go to [email or web address] - password [email or web address] [facility]  [facility]  Office  [phone]  CC: Primary care physician; Arlyss QueenSELVIDGE,WILLIAM M, MD

## 2014-06-02 NOTE — Clinical Social Work Note (Signed)
Clinical Social Work Assessment  Patient Details  Name: Connie Barr MRN: [id number] Date of Birth: [birth year]  Date of referral:  06/02/14               Reason for consult:  Facility Placement                Permission sought to share information with:  Facility Medical sales representativeContact Representative, Family Supports Permission granted to share information::  Yes, Verbal Permission Granted (Pt with AMS on admission. Pt's surrogate decision makers (nieces) granted verbal permission.)  Name::        Agency::     Relationship::     Contact Information:     Housing/Transportation Living arrangements for the past 2 months:  Single Family Home Source of Information:  Other (Comment Required) (Niece/Connie Barr (next of kin)) Patient Interpreter Needed:  None Criminal Activity/Legal Involvement Pertinent to Current Situation/Hospitalization:  No - Comment as needed Significant Relationships:  Other(Comment) (nieces: Connie Barr and Connie Barr) Lives with:  Self Do you feel safe going back to the place where you live?    Need for family participation in patient care:  Yes (Comment)  Care giving concerns:     Office managerocial Worker assessment / plan:  CSW spoke with pt's niece re: PT recommendation for SNF. Pt not oriented to situation. Pt's niece, Connie Barr, reports pt lives alone and has been to SNF [facility] in past. CSW reviewed placement process and answered questions. Pt's niece reports agreeable to [facility] SN[facility] search and identifies no preference. SNF search initiated. Will f/u with offers.  Employment status:  Retired Database administratornsurance information:  Managed Medicare PT Recommendations:  Skilled Nursing Facility Information / Referral to community resources:  Skilled Nursing Facility  Patient/Family's Response to care:  Pt's niece reports agreeable to above plan.  Patient/Family's Understanding of and Emotional Response to Diagnosis, Current Treatment, and Prognosis:   Emotional Assessment Appearance:     Attitude/Demeanor/Rapport:    Affect (typically observed):    Orientation:  Oriented to Self Alcohol / Substance use:  Not Applicable Psych involvement (Current and /or in the community):  No (Comment)  Discharge Needs  Concerns to be addressed:    Readmission within the last 30 days:  No Current discharge risk:  None Barriers to Discharge:  No Barriers Identified  Connie BurnsJosie Lundyn Barr, MSW, LCSW [phone]

## 2014-06-03 LAB — BASIC METABOLIC PANEL
Anion gap: 5 (ref 5–15)
BUN: 30 mg/dL — ABNORMAL HIGH (ref 6–20)
CO2: 26 mmol/L (ref 22–32)
Calcium: 8.7 mg/dL — ABNORMAL LOW (ref 8.9–10.3)
Chloride: 107 mmol/L (ref 101–111)
Creatinine, Ser: 1.19 mg/dL — ABNORMAL HIGH (ref 0.44–1.00)
GFR calc Af Amer: 45 mL/min — ABNORMAL LOW (ref >60)
GFR calc non Af Amer: 39 mL/min — ABNORMAL LOW (ref >60)
GLUCOSE: 68 mg/dL (ref 65–99)
POTASSIUM: 3.8 mmol/L (ref 3.5–5.1)
Sodium: 138 mmol/L (ref 135–145)

## 2014-06-03 LAB — CBC
HCT: 28.4 % — ABNORMAL LOW (ref 35.0–47.0)
HEMOGLOBIN: 9.4 g/dL — AB (ref 12.0–16.0)
MCH: 30.7 pg (ref 26.0–34.0)
MCHC: 33.1 g/dL (ref 32.0–36.0)
MCV: 92.7 fL (ref 80.0–100.0)
PLATELETS: 168 10*3/uL (ref 150–440)
RBC: 3.07 MIL/uL — ABNORMAL LOW (ref 3.80–5.20)
RDW: 15 % — ABNORMAL HIGH (ref 11.5–14.5)
WBC: 7.4 10*3/uL (ref 3.6–11.0)

## 2014-06-03 MED ORDER — METOPROLOL TARTRATE 25 MG PO TABS: 25.0000 mg | ORAL_TABLET | Freq: Two times a day (BID) | ORAL | Status: DC

## 2014-06-03 MED ORDER — HYDRALAZINE HCL 25 MG PO TABS
25.0000 mg | ORAL_TABLET | Freq: Once | ORAL | Status: AC
  Administered 2014-06-03: 25 mg via ORAL
  Filled 2014-06-03: qty 1

## 2014-06-03 MED ORDER — METOPROLOL TARTRATE 25 MG PO TABS
25.0000 mg | ORAL_TABLET | Freq: Two times a day (BID) | ORAL | Status: DC
  Administered 2014-06-03: 25 mg via ORAL
  Filled 2014-06-03: qty 1

## 2014-06-03 NOTE — Discharge Summary (Signed)
[facility] - [place] at [facility]   PATIENT NAME: Connie Barr    MR#:  [id number]  DATE OF BIRTH:  04-Oct-1922  DATE OF ADMISSION:  06/01/2014 ADMITTING PHYSICIAN: Katha Hamming, MD  DATE OF DISCHARGE: 06/03/2014  PRIMARY CARE PHYSICIAN: Arlyss Queen, MD    ADMISSION DIAGNOSIS:  Anasarca [R60.1] Delirium [R41.0] Hypothermia, initial encounter [T68.XXXA]  DISCHARGE DIAGNOSIS:  Active Problems:   Hypothermia, resolved Acute kidney injury Acute viral syndrome      SECONDARY DIAGNOSIS:   Past Medical History  Diagnosis Date  . Hypertension   . Peripheral edema     HOSPITAL COURSE:  Brief history and physical- patient is a 81 year old female brought into the ED with a chief complaint of altered mental status. Please review history and physical for complete details. Patient's initial temperature was at 93.74F in the ED. According to the family patient has been coughing for 3 days prior to the admission with greenish phlegm.According to the family she stays cold all the time and also in even somewhat of the heaters run some. Patient family is requesting placement secondary to her sundowning and the fact that she lives alone.  Hospital course  Currently patient seems to be at her baseline  * hypothermia in demented patient: Probably secondary to acute viral syndrome: Improved with bair hugger. Blood cultures and urine cultures are with no growth so far. Clinical situation improved with IV fluids. We will discontinue empiric IV antibiotics which were started in the ED.    *Hypothyroidism. Normal TSH level   * dementia ;. Continue Seroquel 50 MG twice a day started by primary care physician a week ago  * Mild acute renal failure likely ATN: Clinically better with IV fluids. creatinine is at 1.19 today  *. Altered mental status likely secondary to acute viral syndrome and underlying dementia . CT head is unremarkable  *Glaucoma  continue eyedrops.   * Generalized weakness -She lives alone family requesting placement, PT is recommending skilled nursing care. Patient will be transferred to skilled nursing care today  DISCHARGE CONDITIONS:  Satisfactory  CONSULTS OBTAINED:  Treatment Team:  Ramonita Lab, MD   PROCEDURES none  DRUG ALLERGIES:  No Known Allergies  DISCHARGE MEDICATIONS:   Current Discharge Medication List    START taking these medications   Details  metoprolol tartrate (LOPRESSOR) 25 MG tablet Take 1 tablet (25 mg total) by mouth 2 (two) times daily. Qty: 60 tablet, Refills: 0      CONTINUE these medications which have NOT CHANGED   Details  acetaminophen (TYLENOL) 500 MG tablet Take 500-1,000 mg by mouth every 6 (six) hours as needed for mild pain or headache.    dorzolamide-timolol (COSOPT) 22.3-6.8 MG/ML ophthalmic solution Place 1 drop into both eyes 2 (two) times daily.    enalapril (VASOTEC) 20 MG tablet Take 20 mg by mouth daily.    Multiple Vitamins-Minerals (CENTRUM SILVER PO) Take 1 tablet by mouth daily.    QUEtiapine (SEROQUEL) 25 MG tablet Take 50 mg by mouth 2 (two) times daily.         DISCHARGE INSTRUCTIONS:   Follow-up with primary care physician in a week at the skilled nursing care  DIET:  Low-salt  DISCHARGE CONDITION:  Satisfactory  ACTIVITY:  As tolerated at, as recommended by physical therapy OXYGEN:  Home Oxygen: No need     DISCHARGE LOCATION:  Skilled nursing care  If you experience worsening of your admission symptoms, develop shortness of breath, life threatening emergency,  suicidal or homicidal thoughts you must seek medical attention immediately by calling 911 or calling your MD immediately  if symptoms less severe.  You Must read complete instructions/literature along with all the possible adverse reactions/side effects for all the Medicines you take and that have been prescribed to you. Take any new Medicines after you have  completely understood and accpet all the possible adverse reactions/side effects.   Please note  You were cared for by a hospitalist during your hospital stay. If you have any questions about your discharge medications or the care you received while you were in the hospital after you are discharged, you can call the unit and asked to speak with the hospitalist on call if the hospitalist that took care of you is not available. Once you are discharged, your primary care physician will handle any further medical issues. Please note that NO REFILLS for any discharge medications will be authorized once you are discharged, as it is imperative that you return to your primary care physician (or establish a relationship with a primary care physician if you do not have one) for your aftercare needs so that they can reassess your need for medications and monitor your lab values.     Today  Chief Complaint  Patient presents with  . Altered Mental Status   Patient is pleasantly confused which is probably her baseline  Review of systems Unobtainable in view of Alzheimer's dementia  VITAL SIGNS:  Blood pressure 165/123, pulse 57, temperature 97.4 F (36.3 C), temperature source Oral, resp. rate 18, height 5\' 3"  (1.6 m), weight 85.276 kg (188 lb), SpO2 100 %.  I/O:   Intake/Output Summary (Last 24 hours) at 06/03/14 1228 Last data filed at 06/03/14 0830  Gross per 24 hour  Intake 1513.33 ml  Output      0 ml  Net 1513.33 ml    PHYSICAL EXAMINATION:  GENERAL:  81 y.o.-year-old patient lying in the bed with no acute distress.  EYES: Pupils equal, round, reactive to light and accommodation. No scleral icterus. Extraocular muscles intact.  HEENT: Head atraumatic, normocephalic. Oropharynx and nasopharynx clear.  NECK:  Supple, no jugular venous distention. No thyroid enlargement, no tenderness.  LUNGS: Normal breath sounds bilaterally, no wheezing, rales,rhonchi or crepitation. No use of accessory  muscles of respiration.  CARDIOVASCULAR: S1, S2 normal. No murmurs, rubs, or gallops.  ABDOMEN: Soft, non-tender, non-distended. Bowel sounds present. No organomegaly or mass.  EXTREMITIES: No pedal edema, cyanosis, or clubbing.  NEUROLOGIC: Cranial nerves II through XII are intact. Muscle strength 5/5 in all extremities. Sensation intact. Gait not checked.  PSYCHIATRIC: The patient is alert and oriented x 3.  SKIN: No obvious rash, lesion, or ulcer.   DATA REVIEW:   CBC  Recent Labs Lab 06/03/14 0418  WBC 7.4  HGB 9.4*  HCT 28.4*  PLT 168    Chemistries   Recent Labs Lab 06/01/14 1445  06/03/14 0418  NA 135  --  138  K 4.2  --  3.8  CL 103  --  107  CO2 26  --  26  GLUCOSE 79  --  68  BUN 36*  --  30*  CREATININE 1.26*  < > 1.19*  CALCIUM 9.3  --  8.7*  AST 24  --   --   ALT 23  --   --   ALKPHOS 77  --   --   BILITOT 0.3  --   --   < > = values in  this interval not displayed.  Cardiac Enzymes  Recent Labs Lab 06/01/14 1445  TROPONINI <0.03    Microbiology Results  Results for orders placed or performed during the hospital encounter of 06/01/14  Urine culture     Status: None (Preliminary result)   Collection Time: 06/01/14  2:45 PM  Result Value Ref Range Status   Specimen Description Urine  Final   Special Requests Normal  Final   Culture NO GROWTH < 24 HOURS  Final   Report Status PENDING  Incomplete  Culture, blood (routine x 2)     Status: None (Preliminary result)   Collection Time: 06/01/14  3:40 PM  Result Value Ref Range Status   Specimen Description BLOOD  Final   Special Requests NONE  Final   Culture NO GROWTH 2 DAYS  Final   Report Status PENDING  Incomplete  Culture, blood (routine x 2)     Status: None (Preliminary result)   Collection Time: 06/01/14  3:49 PM  Result Value Ref Range Status   Specimen Description BLOOD  Final   Special Requests NONE  Final   Culture NO GROWTH 2 DAYS  Final   Report Status PENDING  Incomplete     RADIOLOGY:  Ct Head Wo Contrast  06/01/2014   CLINICAL DATA:  Altered mental status  EXAM: CT HEAD WITHOUT CONTRAST  TECHNIQUE: Contiguous axial images were obtained from the base of the skull through the vertex without intravenous contrast.  COMPARISON:  05/06/2009  FINDINGS: There is no evidence of mass effect, midline shift, or extra-axial fluid collections. There is no evidence of a space-occupying lesion or intracranial hemorrhage. There is no evidence of a cortical-based area of acute infarction. There is generalized cerebral atrophy. There is periventricular white matter low attenuation likely secondary to microangiopathy.  The ventricles and sulci are appropriate for the patient's age. The basal cisterns are patent.  Visualized portions of the orbits are unremarkable. The visualized portions of the paranasal sinuses and mastoid air cells are unremarkable. Cerebrovascular atherosclerotic calcifications are noted.  The osseous structures are unremarkable.  IMPRESSION: 1. No acute intracranial pathology. 2. Chronic microvascular disease and cerebral atrophy.   Electronically Signed   By: Elige Ko   On: 06/01/2014 15:55   Dg Chest Port 1 View  06/01/2014   CLINICAL DATA:  Altered mental status.  Chest pain.  EXAM: PORTABLE CHEST - 1 VIEW  COMPARISON:  08/06/2011  FINDINGS: Patient is rotated towards the left. There is mild linear airspace disease at the left lung base likely reflecting discoid atelectasis. There is no focal parenchymal opacity. There is no pleural effusion or pneumothorax. The heart mediastinum are stable.  There is severe osteoarthritis of the left glenohumeral joint.  IMPRESSION: No active disease.   Electronically Signed   By: Elige Ko   On: 06/01/2014 15:40    EKG:   Orders placed or performed during the hospital encounter of 06/01/14  . ED EKG  . ED EKG  . EKG 12-Lead  . EKG 12-Lead        CODE STATUS:     Code Status Orders        Start     Ordered    06/01/14 2027  Full code   Continuous     06/01/14 2026      TOTAL TIME TAKING CARE OF THIS PATIENT: 45 minutes.    @  on 06/03/2014 at 12:28 PM  Between 7am to 6pm - Pager - [phone]  After  6pm go to [email or web address] - password [email or web address] [facility]  [facility]  Office  [phone]  CC: Primary care physician; Arlyss Queen, MD

## 2014-06-03 NOTE — Progress Notes (Signed)
Pt BP elevated-  MD Gouru made aware

## 2014-06-03 NOTE — Progress Notes (Signed)
Bed offers presented to pt's niece, Thomasenia SalesLena, who has accepted offer from [facility]. Pt for d/c today to [facility] via EMS. Pt's niece, Thomasenia SalesLena, reports agreeable to d/c today. D/C summary faxed to nursing supervisor, Dewayne Hatchnn, at [facility]. Packet complete and RN to call report. CSW signing off. Dellie BurnsJosie Jayzon Taras, MSW, LCSW [phone] (weekend coverage)

## 2014-06-03 NOTE — Clinical Social Work Placement (Signed)
   CLINICAL SOCIAL WORK PLACEMENT  NOTE  Date:  06/03/2014  Patient Details  Name: Connie Barr MRN: [id number] Date of Birth: 11-Mar-1922  Clinical Social Work is seeking post-discharge placement for this patient at the Skilled  Nursing Facility level of care (*CSW will initial, date and re-position this form in  chart as items are completed):  Yes   Patient/family provided with [place] Clinical Social Work Department's list of facilities offering this level of care within the geographic area requested by the patient (or if unable, by the patient's family).  Yes   Patient/family informed of their freedom to choose among providers that offer the needed level of care, that participate in Medicare, Medicaid or managed care program needed by the patient, have an available bed and are willing to accept the patient.  Yes   Patient/family informed of [place]'s ownership interest in [facility] and [facility], as well as of the fact that they are under no obligation to receive care at these facilities.  PASRR submitted to EDS on       PASRR number received on       Existing PASRR number confirmed on 06/03/14     FL2 transmitted to all facilities in geographic area requested by pt/family on 06/03/14     FL2 transmitted to all facilities within larger geographic area on       Patient informed that his/her managed care company has contracts with or will negotiate with certain facilities, including the following:            Patient/family informed of bed offers received.  Patient chooses bed at [facility]     Physician recommends and patient chooses bed at [facility]    Patient to be transferred to [facility] on 06/03/14.  Patient to be transferred to facility [facility]     Patient family notified on 06/03/14 of transfer.  Name of family member notified:  Connie Barr     PHYSICIAN Please prepare priority discharge summary,  including medications, Please sign FL2, Please prepare prescriptions     Additional Comment:    _______________________________________________ Deatra RobinsonBarnes, Carlesha Seiple Elizabeth, LCSW 06/03/2014, 12:47 PM

## 2014-06-03 NOTE — Progress Notes (Signed)
Report called to [facility]/ iv and tele removed/ EMS called to transport to SNF.

## 2014-06-06 LAB — URINE CULTURE
Culture: 80000
Special Requests: NORMAL

## 2014-06-06 LAB — CULTURE, BLOOD (ROUTINE X 2)
CULTURE: NO GROWTH
Culture: NO GROWTH

## 2014-07-03 ENCOUNTER — Encounter: Payer: Self-pay | Admitting: *Deleted

## 2014-07-03 ENCOUNTER — Inpatient Hospital Stay
Admission: EM | Admit: 2014-07-03 | Discharge: 2014-07-06 | DRG: 872 | Payer: Medicare Other | Attending: Internal Medicine | Admitting: Internal Medicine

## 2014-07-03 ENCOUNTER — Emergency Department: Payer: Medicare Other

## 2014-07-03 DIAGNOSIS — Z8719 Personal history of other diseases of the digestive system: Secondary | ICD-10-CM

## 2014-07-03 DIAGNOSIS — E162 Hypoglycemia, unspecified: Secondary | ICD-10-CM | POA: Diagnosis present

## 2014-07-03 DIAGNOSIS — Z515 Encounter for palliative care: Secondary | ICD-10-CM | POA: Diagnosis not present

## 2014-07-03 DIAGNOSIS — T68XXXA Hypothermia, initial encounter: Secondary | ICD-10-CM | POA: Diagnosis present

## 2014-07-03 DIAGNOSIS — Z9889 Other specified postprocedural states: Secondary | ICD-10-CM | POA: Diagnosis not present

## 2014-07-03 DIAGNOSIS — Z79899 Other long term (current) drug therapy: Secondary | ICD-10-CM | POA: Diagnosis not present

## 2014-07-03 DIAGNOSIS — F0390 Unspecified dementia without behavioral disturbance: Secondary | ICD-10-CM | POA: Diagnosis not present

## 2014-07-03 DIAGNOSIS — F0391 Unspecified dementia with behavioral disturbance: Secondary | ICD-10-CM | POA: Diagnosis present

## 2014-07-03 DIAGNOSIS — R451 Restlessness and agitation: Secondary | ICD-10-CM | POA: Diagnosis not present

## 2014-07-03 DIAGNOSIS — R627 Adult failure to thrive: Secondary | ICD-10-CM | POA: Diagnosis present

## 2014-07-03 DIAGNOSIS — I440 Atrioventricular block, first degree: Secondary | ICD-10-CM | POA: Diagnosis present

## 2014-07-03 DIAGNOSIS — B967 Clostridium perfringens [C. perfringens] as the cause of diseases classified elsewhere: Secondary | ICD-10-CM | POA: Diagnosis not present

## 2014-07-03 DIAGNOSIS — R197 Diarrhea, unspecified: Secondary | ICD-10-CM | POA: Diagnosis present

## 2014-07-03 DIAGNOSIS — N390 Urinary tract infection, site not specified: Secondary | ICD-10-CM | POA: Diagnosis not present

## 2014-07-03 DIAGNOSIS — R6520 Severe sepsis without septic shock: Secondary | ICD-10-CM | POA: Diagnosis present

## 2014-07-03 DIAGNOSIS — I959 Hypotension, unspecified: Secondary | ICD-10-CM | POA: Diagnosis present

## 2014-07-03 DIAGNOSIS — A419 Sepsis, unspecified organism: Secondary | ICD-10-CM | POA: Diagnosis not present

## 2014-07-03 DIAGNOSIS — I10 Essential (primary) hypertension: Secondary | ICD-10-CM | POA: Diagnosis present

## 2014-07-03 DIAGNOSIS — Z66 Do not resuscitate: Secondary | ICD-10-CM | POA: Diagnosis not present

## 2014-07-03 DIAGNOSIS — R4182 Altered mental status, unspecified: Secondary | ICD-10-CM | POA: Diagnosis not present

## 2014-07-03 DIAGNOSIS — R600 Localized edema: Secondary | ICD-10-CM | POA: Diagnosis present

## 2014-07-03 HISTORY — DX: Enterocolitis due to Clostridium difficile, not specified as recurrent: A04.72

## 2014-07-03 HISTORY — DX: Unspecified dementia, unspecified severity, without behavioral disturbance, psychotic disturbance, mood disturbance, and anxiety: F03.90

## 2014-07-03 HISTORY — DX: Hypothermia, initial encounter: T68.XXXA

## 2014-07-03 LAB — URINALYSIS COMPLETE WITH MICROSCOPIC (ARMC ONLY)
BACTERIA UA: NONE SEEN
Bilirubin Urine: NEGATIVE
Glucose, UA: NEGATIVE mg/dL
HGB URINE DIPSTICK: NEGATIVE
Ketones, ur: NEGATIVE mg/dL
Nitrite: NEGATIVE
PH: 6 (ref 5.0–8.0)
Protein, ur: NEGATIVE mg/dL
SPECIFIC GRAVITY, URINE: 1.008 (ref 1.005–1.030)
Squamous Epithelial / LPF: NONE SEEN

## 2014-07-03 LAB — BASIC METABOLIC PANEL
ANION GAP: 4 — AB (ref 5–15)
BUN: 22 mg/dL — ABNORMAL HIGH (ref 6–20)
CHLORIDE: 102 mmol/L (ref 101–111)
CO2: 27 mmol/L (ref 22–32)
CREATININE: 0.87 mg/dL (ref 0.44–1.00)
Calcium: 8.9 mg/dL (ref 8.9–10.3)
GFR calc Af Amer: >60 mL/min (ref >60)
GFR calc non Af Amer: 57 mL/min — ABNORMAL LOW (ref >60)
Glucose, Bld: 84 mg/dL (ref 65–99)
POTASSIUM: 4.3 mmol/L (ref 3.5–5.1)
Sodium: 133 mmol/L — ABNORMAL LOW (ref 135–145)

## 2014-07-03 LAB — CBC WITH DIFFERENTIAL/PLATELET
BASOS ABS: 0 10*3/uL (ref 0–0.1)
Basophils Relative: 0 %
EOS PCT: 0 %
Eosinophils Absolute: 0 10*3/uL (ref 0–0.7)
HCT: 32.2 % — ABNORMAL LOW (ref 35.0–47.0)
Hemoglobin: 10.8 g/dL — ABNORMAL LOW (ref 12.0–16.0)
Lymphocytes Relative: 14 %
Lymphs Abs: 0.7 10*3/uL — ABNORMAL LOW (ref 1.0–3.6)
MCH: 29.8 pg (ref 26.0–34.0)
MCHC: 33.5 g/dL (ref 32.0–36.0)
MCV: 89 fL (ref 80.0–100.0)
Monocytes Absolute: 0.4 10*3/uL (ref 0.2–0.9)
Monocytes Relative: 9 %
Neutro Abs: 3.7 10*3/uL (ref 1.4–6.5)
Neutrophils Relative %: 77 %
PLATELETS: 231 10*3/uL (ref 150–440)
RBC: 3.61 MIL/uL — AB (ref 3.80–5.20)
RDW: 15.8 % — AB (ref 11.5–14.5)
WBC: 4.8 10*3/uL (ref 3.6–11.0)

## 2014-07-03 LAB — TROPONIN I: Troponin I: 0.03 ng/mL (ref <0.031)

## 2014-07-03 MED ORDER — ONDANSETRON HCL 4 MG PO TABS: 4.0000 mg | ORAL_TABLET | Freq: Four times a day (QID) | ORAL | Status: DC | PRN

## 2014-07-03 MED ORDER — POTASSIUM CHLORIDE CRYS ER 20 MEQ PO TBCR: 40.0000 meq | EXTENDED_RELEASE_TABLET | Freq: Every day | ORAL | Status: DC | PRN

## 2014-07-03 MED ORDER — ENOXAPARIN SODIUM 40 MG/0.4ML ~~LOC~~ SOLN
40.0000 mg | SUBCUTANEOUS | Status: DC
Start: 2014-07-03 — End: 2014-07-04
  Administered 2014-07-04: 40 mg via SUBCUTANEOUS
  Filled 2014-07-03: qty 0.4

## 2014-07-03 MED ORDER — ENALAPRIL MALEATE 10 MG PO TABS: 20.0000 mg | ORAL_TABLET | Freq: Every morning | ORAL | Status: DC

## 2014-07-03 MED ORDER — ACETAMINOPHEN 650 MG RE SUPP: 650.0000 mg | Freq: Four times a day (QID) | RECTAL | Status: DC | PRN

## 2014-07-03 MED ORDER — QUETIAPINE FUMARATE 25 MG PO TABS: 50.0000 mg | ORAL_TABLET | Freq: Two times a day (BID) | ORAL | Status: DC

## 2014-07-03 MED ORDER — LOPERAMIDE HCL 2 MG PO CAPS: 2.0000 mg | ORAL_CAPSULE | ORAL | Status: DC | PRN

## 2014-07-03 MED ORDER — ONDANSETRON HCL 4 MG/2ML IJ SOLN: 4.0000 mg | Freq: Four times a day (QID) | INTRAMUSCULAR | Status: DC | PRN

## 2014-07-03 MED ORDER — DORZOLAMIDE HCL-TIMOLOL MAL 2-0.5 % OP SOLN
1.0000 [drp] | Freq: Two times a day (BID) | OPHTHALMIC | Status: DC
  Administered 2014-07-04 – 2014-07-05 (×4): 1 [drp] via OPHTHALMIC
  Filled 2014-07-03: qty 10

## 2014-07-03 MED ORDER — ACETAMINOPHEN 500 MG PO TABS: 500.0000 mg | ORAL_TABLET | Freq: Four times a day (QID) | ORAL | Status: DC | PRN

## 2014-07-03 MED ORDER — ALUM & MAG HYDROXIDE-SIMETH 200-200-20 MG/5ML PO SUSP: 30.0000 mL | Freq: Four times a day (QID) | ORAL | Status: DC | PRN

## 2014-07-03 MED ORDER — CEFTRIAXONE SODIUM IN DEXTROSE 20 MG/ML IV SOLN: 1.0000 g | INTRAVENOUS | Status: DC

## 2014-07-03 MED ORDER — SODIUM CHLORIDE 0.9 % IV SOLN
INTRAVENOUS | Status: DC
  Administered 2014-07-03 – 2014-07-04 (×2): via INTRAVENOUS

## 2014-07-03 MED ORDER — CEFTRIAXONE SODIUM IN DEXTROSE 20 MG/ML IV SOLN
INTRAVENOUS | Status: AC
  Administered 2014-07-03: 1 g via INTRAVENOUS
  Filled 2014-07-03: qty 50

## 2014-07-03 MED ORDER — ACETAMINOPHEN 325 MG PO TABS: 650.0000 mg | ORAL_TABLET | Freq: Four times a day (QID) | ORAL | Status: DC | PRN

## 2014-07-03 MED ORDER — CEFTRIAXONE SODIUM IN DEXTROSE 20 MG/ML IV SOLN
1.0000 g | Freq: Once | INTRAVENOUS | Status: AC
  Administered 2014-07-03: 1 g via INTRAVENOUS

## 2014-07-03 MED ORDER — FIDAXOMICIN 200 MG PO TABS
200.0000 mg | ORAL_TABLET | Freq: Two times a day (BID) | ORAL | Status: DC
  Filled 2014-07-03 (×3): qty 1

## 2014-07-03 NOTE — ED Notes (Signed)
Bair Hugger applied per Dr. Robbi Garter VO.

## 2014-07-03 NOTE — H&P (Addendum)
[facility] - [place] at [facility]   PATIENT NAME: Connie Barr    MR#:  [id number]  DATE OF BIRTH:  1922/12/24  DATE OF ADMISSION:  07/03/2014  PRIMARY CARE PHYSICIAN: Arlyss Queen, MD   REQUESTING/REFERRING PHYSICIAN: Dr. Derrill Kay  CHIEF COMPLAINT:   AMS and hypothermia with diarrhea  HISTORY OF PRESENT ILLNESS:  Connie Barr  is a 81 y.o. female with a known history of dementia, hypertension with recent admission into the hospital for hypothermia who presents  from skilled nursing facility with above complaint. Patient was discharged from our facility to skilled nursing facility. Over the past 2 weeks patient's had a downward spiral according to the family was at bedside. Patient has been more confused, lethargic and has severe diarrhea. She was recently treated for C. difficile colitis area her last day of medications is today. In the emergency department she was noted be hypothermic. She currently has a bear hugger in place. She is very confused. Her baseline dementia has also worsened over several weeks as well.  PAST MEDICAL HISTORY:   Past Medical History  Diagnosis Date  . Hypertension   . Peripheral edema    Dementia C. difficile colitis PAST SURGICAL HISTORY:   Past Surgical History  Procedure Laterality Date  . Pelvic tumor      removed in the 1960s    SOCIAL HISTORY:   History  Substance Use Topics  . Smoking status: Never Smoker   . Smokeless tobacco: Not on file  . Alcohol Use: No    FAMILY HISTORY:  No history of CAD or diabetes  DRUG ALLERGIES:  No Known Allergies   REVIEW OF SYSTEMS:  Unable to obtain as patient is very confused. MEDICATIONS AT HOME:   Prior to Admission medications   Medication Sig Start Date End Date Taking? Authorizing Provider  acetaminophen (TYLENOL) 500 MG tablet Take 500-1,000 mg by mouth every 6 (six) hours as needed for mild pain or headache.   Yes Historical Provider, MD   dorzolamide-timolol (COSOPT) 22.3-6.8 MG/ML ophthalmic solution Place 1 drop into both eyes 2 (two) times daily.   Yes Historical Provider, MD  enalapril (VASOTEC) 20 MG tablet Take 20 mg by mouth every morning.    Yes Historical Provider, MD  fidaxomicin (DIFICID) 200 MG TABS tablet Take 200 mg by mouth 2 (two) times daily. 06/23/14 07/04/14 Yes Historical Provider, MD  furosemide (LASIX) 20 MG tablet Take 20 mg by mouth daily as needed for edema.   Yes Historical Provider, MD  loperamide (IMODIUM) 2 MG capsule Take 2 mg by mouth every 4 (four) hours as needed for diarrhea or loose stools.    Yes Historical Provider, MD  metoprolol tartrate (LOPRESSOR) 25 MG tablet Take 1 tablet (25 mg total) by mouth 2 (two) times daily. 06/03/14  Yes Ramonita Lab, MD  Multiple Vitamins-Minerals (CENTRUM SILVER PO) Take 1 tablet by mouth daily.   Yes Historical Provider, MD  potassium chloride SA (K-DUR,KLOR-CON) 20 MEQ tablet Take 40 mEq by mouth daily as needed (when taking Lasix).   Yes Historical Provider, MD  QUEtiapine (SEROQUEL) 25 MG tablet Take 50 mg by mouth 2 (two) times daily.   Yes Historical Provider, MD      VITAL SIGNS:  Blood pressure 127/92, pulse 43, temperature 86.7 F (30.4 C), temperature source Rectal, resp. rate 18, weight 80.287 kg (177 lb), SpO2 100 %.  PHYSICAL EXAMINATION:  GENERAL:  81 y.o.-year-old patient lying in the bed  appears dazed and confused  EYES: Pupils equal, round, reactive to light and accommodation. No scleral icterus. HEENT: Head atraumatic, normocephalic. Oropharynx and nasopharynx clear.  NECK:  Supple, no jugular venous distention. No thyroid enlargement, no tenderness.  LUNGS: Normal breath sounds bilaterally, no wheezing, rales,rhonchi or crepitation. No use of accessory muscles of respiration.  CARDIOVASCULAR: S1, S2 normal. No murmurs, rubs, or gallops.  ABDOMEN: Soft, nontender, nondistended. Bowel sounds present. No organomegaly or mass.  EXTREMITIES: No  pedal edema, cyanosis, or clubbing.  NEUROLOGIC: Hard to do a neuro exam as the patient does not follow commands well. Cranial nerves II through XII are grossly intact  PSYCHIATRIC: She is confused  SKIN: No obvious rash, lesion, or ulcer.   LABORATORY PANEL:   CBC  Recent Labs Lab 07/03/14 1722  WBC 4.8  HGB 10.8*  HCT 32.2*  PLT 231   ------------------------------------------------------------------------------------------------------------------  Chemistries   Recent Labs Lab 07/03/14 1722  NA 133*  K 4.3  CL 102  CO2 27  GLUCOSE 84  BUN 22*  CREATININE 0.87  CALCIUM 8.9   ------------------------------------------------------------------------------------------------------------------  Cardiac Enzymes  Recent Labs Lab 07/03/14 1722  TROPONINI 0.03   ------------------------------------------------------------------------------------------------------------------  RADIOLOGY:  Ct Head Wo Contrast  07/03/2014   CLINICAL DATA:  Altered mental status.  Baseline dementia.  EXAM: CT HEAD WITHOUT CONTRAST  TECHNIQUE: Contiguous axial images were obtained from the base of the skull through the vertex without intravenous contrast.  COMPARISON:  06/01/2014  FINDINGS: Sinuses/Soft tissues: Minimal left maxillary sinus mucosal thickening. Other paranasal sinuses and mastoid air cells clear.  Intracranial: Mild low density in the periventricular white matter likely related to small vessel disease. Expected cerebral volume loss for age. Left vertebral carotid atherosclerosis. No mass lesion, hemorrhage, hydrocephalus, acute infarct, intra-axial, or extra-axial fluid collection.  IMPRESSION: 1.  No acute intracranial abnormality. 2. Mild small vessel ischemic change. 3. Minimal sinus disease.   Electronically Signed   By: Jeronimo Greaves M.D.   On: 07/03/2014 16:19    EKG:  Sinus bradycardia with first-degree AV block with right bundle branch block  IMPRESSION AND PLAN:  This  is a 81 year old female with a history of dementia and hypertension who presents with hypothermia and ongoing diarrhea.  1. Hypothermia: Patient does appear to have it urinary tract infection. She has ongoing diarrhea as well. I will continue Rocephin for urinary tract infection. Stool cultures including C. difficile has been ordered. Patient's currently interested. I will continue this deficit as well. Patient has a bear hugger in place which I will continue as well. Blood and urine cultures were ordered by the ER physician which we will need to follow-up on.  2. Dementia: Patient will continue on Seroquel. Patient may need a sitter at nighttime as she does have significant [place]  3. Altered mental status: This is secondary to hypothermia and her underlying infection. CT of the head was performed which was unremarkable.  4. Hypertension: Patient is on enalapril and metoprolol due to low heart rates I am holding metoprolol.  5. Sinus pericardia with first degree AV block: Patient will need telemetry monitoring. I will hold metoprolol.   All the records are reviewed and case discussed with ED provider. Management plans discussed with the patient's family and they are in agreement. CODE STATUS: DO NOT INTUBATE   Critical care TOTAL TIME TAKING CARE OF THIS PATIENT: 50 minutes.    Connie Barr M.D on 07/03/2014 at 6:51 PM  Between 7am to 6pm - Pager - [phone] After 6pm go to  [email or web address] - password [email or web address] [facility]  [facility]  Office  [phone]  CC: Primary care physician; Arlyss Queen, MD

## 2014-07-03 NOTE — ED Provider Notes (Signed)
[facility] Emergency Department Provider Note   ____________________________________________  Time seen: On EMS arrival  I have reviewed the triage vital signs and the nursing notes.   HISTORY  Chief Complaint Altered Mental Status   History limited by: Altered Mental Status   HPI Connie Barr is a 81 y.o. female who comes from nursing facility today because of altered mental status. The patient is normally verbal however staff today noticed the patient became less verbal. Per EMS when they arrived there patient was bradycardic. They gave her a total of 1 mg atropine with minimal improvement to the bradycardia. Per chart review patient had an admission last month for hypothermia and altered mental status. Unfortunately the patient herself is not able to provide any history.     Past Medical History  Diagnosis Date  . Hypertension   . Peripheral edema     Patient Active Problem List   Diagnosis Date Noted  . Hypothermia 06/01/2014    Past Surgical History  Procedure Laterality Date  . Pelvic tumor      removed in the 1960s    Current Outpatient Rx  Name  Route  Sig  Dispense  Refill  . acetaminophen (TYLENOL) 500 MG tablet   Oral   Take 500-1,000 mg by mouth every 6 (six) hours as needed for mild pain or headache.         . dorzolamide-timolol (COSOPT) 22.3-6.8 MG/ML ophthalmic solution   Both Eyes   Place 1 drop into both eyes 2 (two) times daily.         . enalapril (VASOTEC) 20 MG tablet   Oral   Take 20 mg by mouth every morning.          . fidaxomicin (DIFICID) 200 MG TABS tablet   Oral   Take 200 mg by mouth 2 (two) times daily.         . furosemide (LASIX) 20 MG tablet   Oral   Take 20 mg by mouth daily as needed for edema.         Marland Kitchen loperamide (IMODIUM) 2 MG capsule   Oral   Take by mouth as needed for diarrhea or loose stools.         . Multiple Vitamins-Minerals (CENTRUM SILVER PO)   Oral   Take 1  tablet by mouth daily.         . metoprolol tartrate (LOPRESSOR) 25 MG tablet   Oral   Take 1 tablet (25 mg total) by mouth 2 (two) times daily.   60 tablet   0   . QUEtiapine (SEROQUEL) 25 MG tablet   Oral   Take 50 mg by mouth 2 (two) times daily.           Allergies Review of patient's allergies indicates no known allergies.  No family history on file.  Social History History  Substance Use Topics  . Smoking status: Never Smoker   . Smokeless tobacco: Not on file  . Alcohol Use: No    Review of Systems  Unable to obtain secondary to dementia ____________________________________________   PHYSICAL EXAM:  VITAL SIGNS: ED Triage Vitals  Enc Vitals Group     BP 07/03/14 1537 151/74 mmHg     Pulse Rate 07/03/14 1537 44     Resp --      Temp --      Temp src --      SpO2 07/03/14 1537 100 %     Weight  07/03/14 1537 177 lb (80.287 kg)   Constitutional: Awake and alert Eyes: Conjunctivae are normal. PERRL. Normal extraocular movements. ENT   Head: Normocephalic and atraumatic.   Nose: No congestion/rhinnorhea.   Mouth/Throat: Mucous membranes are moist.   Neck: No stridor. Hematological/Lymphatic/Immunilogical: No cervical lymphadenopathy. Cardiovascular: Normal rate, regular rhythm.  No murmurs, rubs, or gallops. Respiratory: Normal respiratory effort without tachypnea nor retractions. Breath sounds are clear and equal bilaterally. No wheezes/rales/rhonchi. Gastrointestinal: Soft and nontender. No distention.  Genitourinary: Deferred Musculoskeletal: Normal range of motion in all extremities. No joint effusions.  No lower extremity tenderness nor edema. Neurologic:  Awake, appears alert, will try to track. Will not follow commands.  Skin:  Skin is warm, dry and intact. No rash noted.   ____________________________________________    LABS (pertinent positives/negatives)  Labs Reviewed  CBC WITH DIFFERENTIAL/PLATELET - Abnormal; Notable  for the following:    RBC 3.61 (*)    Hemoglobin 10.8 (*)    HCT 32.2 (*)    RDW 15.8 (*)    Lymphs Abs 0.7 (*)    All other components within normal limits  BASIC METABOLIC PANEL - Abnormal; Notable for the following:    Sodium 133 (*)    BUN 22 (*)    GFR calc non Af Amer 57 (*)    Anion gap 4 (*)    All other components within normal limits  URINALYSIS COMPLETEWITH MICROSCOPIC ([facility] ONLY) - Abnormal; Notable for the following:    Color, Urine YELLOW (*)    APPearance CLEAR (*)    Leukocytes, UA 2+ (*)    All other components within normal limits  URINE CULTURE  TROPONIN I     ____________________________________________   EKG  I, Phineas Semen, attending physician, personally viewed and interpreted this EKG  EKG Time: 1630 Rate: 43 Rhythm: sinus bradycardia Axis: normal Intervals: qtc 540 QRS: RBBB ST changes: no st elevation    ____________________________________________    RADIOLOGY  CT head  IMPRESSION: 1. No acute intracranial abnormality. 2. Mild small vessel ischemic change. 3. Minimal sinus disease.  ____________________________________________   PROCEDURES  Procedure(s) performed: None  Critical Care performed: No  ____________________________________________   INITIAL IMPRESSION / ASSESSMENT AND PLAN / ED COURSE  Pertinent labs & imaging results that were available during my care of the patient were reviewed by me and considered in my medical decision making (see chart for details).  Patient presents from living facility because of decreased mental status. On exam patient awake, appears to be somewhat alert and trying to track. Patient called to touch and found to be hypothermic on testing. Patient did have a similar admission for hypothermia 1 month ago. Additionally. Patient's urine concerning for urinary tract infection. Will plan on admission and will treat for UTI.  ____________________________________________   FINAL  CLINICAL IMPRESSION(S) / ED DIAGNOSES  Final diagnoses:  Altered mental status, unspecified altered mental status type  Hypothermia, initial encounter  UTI (lower urinary tract infection)     Phineas Semen, MD 07/03/14 [phone]

## 2014-07-03 NOTE — ED Notes (Addendum)
Per EMS report, patient is a resident at [facility]. Staff were concerned today that patient was non-verbal, but alert, staying in bed and "picking at invisible things," which is a change from her baseline of dementia. Staff were not able to obtain VS. EMS report they obtained a heart rate in the 30's upon their arrival, gave 1mg  of Atropine in 0.5mg  doses and obtained a heart rate in the mid 40's. Patient does not maintain eye contact and does not respond to verbal commands. Patient finished antibiotics for C-diff yesterday.

## 2014-07-03 NOTE — ED Notes (Signed)
Family at bedside. 

## 2014-07-04 DIAGNOSIS — R609 Edema, unspecified: Secondary | ICD-10-CM

## 2014-07-04 DIAGNOSIS — R1310 Dysphagia, unspecified: Secondary | ICD-10-CM

## 2014-07-04 DIAGNOSIS — R001 Bradycardia, unspecified: Secondary | ICD-10-CM

## 2014-07-04 DIAGNOSIS — F918 Other conduct disorders: Secondary | ICD-10-CM

## 2014-07-04 DIAGNOSIS — B967 Clostridium perfringens [C. perfringens] as the cause of diseases classified elsewhere: Secondary | ICD-10-CM

## 2014-07-04 DIAGNOSIS — R4182 Altered mental status, unspecified: Secondary | ICD-10-CM

## 2014-07-04 DIAGNOSIS — T68XXXA Hypothermia, initial encounter: Secondary | ICD-10-CM

## 2014-07-04 LAB — CBC
HCT: 31.9 % — ABNORMAL LOW (ref 35.0–47.0)
Hemoglobin: 10.3 g/dL — ABNORMAL LOW (ref 12.0–16.0)
MCH: 29 pg (ref 26.0–34.0)
MCHC: 32.4 g/dL (ref 32.0–36.0)
MCV: 89.3 fL (ref 80.0–100.0)
PLATELETS: 228 10*3/uL (ref 150–440)
RBC: 3.57 MIL/uL — AB (ref 3.80–5.20)
RDW: 15.8 % — ABNORMAL HIGH (ref 11.5–14.5)
WBC: 5.1 10*3/uL (ref 3.6–11.0)

## 2014-07-04 LAB — COMPREHENSIVE METABOLIC PANEL
ALK PHOS: 108 U/L (ref 38–126)
ALT: 47 U/L (ref 14–54)
AST: 42 U/L — ABNORMAL HIGH (ref 15–41)
Albumin: 2.5 g/dL — ABNORMAL LOW (ref 3.5–5.0)
Anion gap: 5 (ref 5–15)
BILIRUBIN TOTAL: 0.3 mg/dL (ref 0.3–1.2)
BUN: 24 mg/dL — AB (ref 6–20)
CO2: 25 mmol/L (ref 22–32)
CREATININE: 1.02 mg/dL — AB (ref 0.44–1.00)
Calcium: 8.2 mg/dL — ABNORMAL LOW (ref 8.9–10.3)
Chloride: 105 mmol/L (ref 101–111)
GFR, EST AFRICAN AMERICAN: 54 mL/min — AB (ref >60)
GFR, EST NON AFRICAN AMERICAN: 47 mL/min — AB (ref >60)
GLUCOSE: 45 mg/dL — AB (ref 65–99)
POTASSIUM: 4.2 mmol/L (ref 3.5–5.1)
Sodium: 135 mmol/L (ref 135–145)
Total Protein: 5.2 g/dL — ABNORMAL LOW (ref 6.5–8.1)

## 2014-07-04 LAB — CREATININE, SERUM
Creatinine, Ser: 0.94 mg/dL (ref 0.44–1.00)
GFR, EST NON AFRICAN AMERICAN: 52 mL/min — AB (ref >60)

## 2014-07-04 LAB — MRSA PCR SCREENING: MRSA by PCR: NEGATIVE

## 2014-07-04 LAB — GLUCOSE, CAPILLARY
GLUCOSE-CAPILLARY: 139 mg/dL — AB (ref 65–99)
Glucose-Capillary: 122 mg/dL — ABNORMAL HIGH (ref 65–99)
Glucose-Capillary: 50 mg/dL — ABNORMAL LOW (ref 65–99)
Glucose-Capillary: 52 mg/dL — ABNORMAL LOW (ref 65–99)

## 2014-07-04 MED ORDER — DEXTROSE 50 % IV SOLN
25.0000 g | Freq: Once | INTRAVENOUS | Status: AC
  Administered 2014-07-04: 25 g via INTRAVENOUS

## 2014-07-04 MED ORDER — ENOXAPARIN SODIUM 40 MG/0.4ML ~~LOC~~ SOLN
40.0000 mg | SUBCUTANEOUS | Status: DC
  Administered 2014-07-04 – 2014-07-05 (×2): 40 mg via SUBCUTANEOUS
  Filled 2014-07-04 (×2): qty 0.4

## 2014-07-04 MED ORDER — METRONIDAZOLE IN NACL 5-0.79 MG/ML-% IV SOLN
500.0000 mg | Freq: Three times a day (TID) | INTRAVENOUS | Status: DC
  Administered 2014-07-04 – 2014-07-06 (×6): 500 mg via INTRAVENOUS
  Filled 2014-07-04 (×12): qty 100

## 2014-07-04 MED ORDER — SODIUM CHLORIDE 0.9 % IV BOLUS (SEPSIS)
1000.0000 mL | INTRAVENOUS | Status: DC | PRN
  Administered 2014-07-04 (×2): 1000 mL via INTRAVENOUS
  Filled 2014-07-04 (×2): qty 1000

## 2014-07-04 MED ORDER — CEFTRIAXONE SODIUM IN DEXTROSE 20 MG/ML IV SOLN
1.0000 g | INTRAVENOUS | Status: DC
  Administered 2014-07-04 – 2014-07-05 (×2): 1 g via INTRAVENOUS
  Filled 2014-07-04 (×3): qty 50

## 2014-07-04 MED ORDER — DEXTROSE 50 % IV SOLN
INTRAVENOUS | Status: AC
  Filled 2014-07-04: qty 50

## 2014-07-04 MED ORDER — DEXTROSE 50 % IV SOLN
INTRAVENOUS | Status: AC
  Administered 2014-07-04: 25 g via INTRAVENOUS
  Filled 2014-07-04: qty 50

## 2014-07-04 NOTE — Evaluation (Signed)
Clinical/Bedside Swallow Evaluation Patient Details  Name: Connie Barr MRN: [id number] Date of Birth: [birth year]  Today's Date: 07/04/2014 Time: SLP Start Time (ACUTE ONLY): 1045 SLP Stop Time (ACUTE ONLY): 1145 SLP Time Calculation (min) (ACUTE ONLY): 60 min  Past Medical History:  Past Medical History  Diagnosis Date  . Hypertension   . Peripheral edema   . Dementia   . C. difficile colitis   . Hypothermia    Past Surgical History:  Past Surgical History  Procedure Laterality Date  . Pelvic tumor      removed in the 1960s   HPI:  pt w/ consistent, wet respirations and coughing at rest. Cough was nonproductive in clearing any phlegm. During oral care, noted stringy white, frothy secretions cleared from the oral cavity. Pt was nonverbal; did not follow any commands. Eyes were opened; decreased awareness. Pt does have a baseline of Dementia per chart.    Assessment / Plan / Recommendation Clinical Impression  Pt appears to present w/ profound oropharyngeal phase dysphagia at this BSE today. Pt was given single ice chip trials x3 and single, small TSP amounts of thin liquids (water) x2 w/ no apparent oral awareness of boluses. She did not attend to the boluses to initiate bolus management for manipulation/A-P transfer for swallowing. Pt did not exhibit a pharyngeal swallow up to 1 minute upon palpation after bolus was left in the mouth. Pt continued to exhibit a nonproductive, phlegmy, wet cough throughout the eval. Due to pt's declined oral awarenss, inability to clear and protect hwe own airway from secretions, and high risk for aspiration, no further po trials were given. Rec. pt remain NPO w/ oral care for hygiene and stim. NSG and Palliative Care MD updated on pt's presentation and eval results/recs. All agreed. ST will f/u next 1-2 days for continued assessment as appropriate.     Aspiration Risk  Severe    Diet Recommendation NPO        Other  Recommendations Recommended  Consults:  (TBD) Oral Care Recommendations:  (oral care daily for hygiene and stim.)   Follow Up Recommendations       Frequency and Duration min 3x week  1 week   Pertinent Vitals/Pain No overt s/s of pain/discomfort    SLP Swallow Goals  see care plan    Swallow Study Prior Functional Status   unknown    General Date of Onset: 07/03/14 Other Pertinent Information: pt w/ consistent, wet respirations and coughing at rest. Cough was nonproductive in clearing any phlegm. During oral care, noted stringy white, frothy secretions cleared from the oral cavity. Pt was nonverbal; did not follow any commands. Eyes were opened; decreased awareness. Pt does have a baseline of Dementia per chart.  Type of Study: Bedside swallow evaluation Previous Swallow Assessment: unknown Diet Prior to this Study: NPO Temperature Spikes Noted: No (low temp. on warming blanket/bear hugger) Respiratory Status: Supplemental O2 delivered via (comment) (2 liters; WBC 5.1) History of Recent Intubation: No Behavior/Cognition:  (awake w/ eyes opened; poorly responsive otherwise) Self-Feeding Abilities: Total assist Patient Positioning: Upright in bed Baseline Vocal Quality:  (nonverbal) Volitional Cough: Cognitively unable to elicit Volitional Swallow: Unable to elicit    Oral/Motor/Sensory Function Overall Oral Motor/Sensory Function:  (unable to assess sec. to pt's declined Cognitive status)   Ice Chips Ice chips: Impaired Presentation: Spoon (fed by SLP) Oral Phase Impairments: Poor awareness of bolus;Impaired anterior to posterior transit;Reduced lingual movement/coordination;Reduced labial seal;Other (comment) ( no response to the ice chip trials  x3/3) Pharyngeal Phase Impairments:  (no pharyngeal swallow appreciated ) Other Comments:  (oral suctioning given)   Thin Liquid Thin Liquid: Impaired Presentation: Spoon (fed by SLP; x2 trials) Oral Phase Impairments: Reduced labial seal;Reduced lingual  movement/coordination;Impaired anterior to posterior transit;Poor awareness of bolus (no apparent awareness/sensation to the trials) Pharyngeal  Phase Impairments:  (no pharyngeal swallow appreciated x1 min.) Other Comments:  (oral suctioning given)    Nectar Thick Nectar Thick Liquid: Not tested   Honey Thick Honey Thick Liquid: Not tested   Puree Puree: Not tested   Solid   GO    Solid: Not tested       [facility] 07/04/2014,3:21 PM

## 2014-07-04 NOTE — Progress Notes (Signed)
[facility] [place] [facility]   PATIENT NAME: Connie Barr    MR#:  [id number]  DATE OF BIRTH:  1922-12-20  SUBJECTIVE:  CHIEF COMPLAINT:   Chief Complaint  Patient presents with  . Altered Mental Status   - Patient with dementia, recent C. difficile infection from May 2016, from [facility] healthcare rehabilitation brought in for hypothermia and hypotension. -Patient unable to provide any information at this time. Not following commands.  REVIEW OF SYSTEMS:  Review of Systems  Unable to perform ROS: dementia    DRUG ALLERGIES:  No Known Allergies  VITALS:  Blood pressure 126/54, pulse 74, temperature 97.9 F (36.6 C), temperature source Rectal, resp. rate 26, weight 78 kg (171 lb 15.3 oz), SpO2 100 %.  PHYSICAL EXAMINATION:  Physical Exam  GENERAL:  81 y.o.-year-old patient lying in the bed, thick secretions and unable to clear them.  EYES: Pupils equal, round, reactive to light and accommodation. No scleral icterus. Extraocular muscles intact.  HEENT: Head atraumatic, normocephalic. Oropharynx with lots of mucoid secretions and nasopharynx clear.  NECK:  Supple, no jugular venous distention. No thyroid enlargement, no tenderness.  LUNGS: Normal breath sounds bilaterally, no wheezing, rales,rhonchi or crepitation. Decreased bibasilar breath sounds. No use of accessory muscles of respiration.  CARDIOVASCULAR: S1, S2 normal. Systolic murmur present, no rubs or gallops. ABDOMEN: Soft, nontender, nondistended. Bowel sounds present. No organomegaly or mass.  EXTREMITIES: 3+ pedal edema of both lower extremities, no cyanosis, or clubbing.  NEUROLOGIC: Cranial nerves II through XII are intact. Patient's not following any commands. Only responding to her name. Lower extremities seem like she is bedbound. PSYCHIATRIC: The patient is alert and not oriented  SKIN: No obvious rash, lesion, or ulcer.    LABORATORY PANEL:   CBC  Recent Labs Lab  07/04/14 0103  WBC 5.1  HGB 10.3*  HCT 31.9*  PLT 228   ------------------------------------------------------------------------------------------------------------------  Chemistries   Recent Labs Lab 07/04/14 0532  NA 135  K 4.2  CL 105  CO2 25  GLUCOSE 45*  BUN 24*  CREATININE 1.02*  CALCIUM 8.2*  AST 42*  ALT 47  ALKPHOS 108  BILITOT 0.3   ------------------------------------------------------------------------------------------------------------------  Cardiac Enzymes  Recent Labs Lab 07/03/14 1722  TROPONINI 0.03   ------------------------------------------------------------------------------------------------------------------  RADIOLOGY:  Ct Head Wo Contrast  07/03/2014   CLINICAL DATA:  Altered mental status.  Baseline dementia.  EXAM: CT HEAD WITHOUT CONTRAST  TECHNIQUE: Contiguous axial images were obtained from the base of the skull through the vertex without intravenous contrast.  COMPARISON:  06/01/2014  FINDINGS: Sinuses/Soft tissues: Minimal left maxillary sinus mucosal thickening. Other paranasal sinuses and mastoid air cells clear.  Intracranial: Mild low density in the periventricular white matter likely related to small vessel disease. Expected cerebral volume loss for age. Left vertebral carotid atherosclerosis. No mass lesion, hemorrhage, hydrocephalus, acute infarct, intra-axial, or extra-axial fluid collection.  IMPRESSION: 1.  No acute intracranial abnormality. 2. Mild small vessel ischemic change. 3. Minimal sinus disease.   Electronically Signed   By: Jeronimo Greaves M.D.   On: 07/03/2014 16:19    EKG:   Orders placed or performed during the hospital encounter of 07/03/14  . ED EKG  . ED EKG  . EKG 12-Lead  . EKG 12-Lead    ASSESSMENT AND PLAN:   81 year old with past medical history significant for dementia, hypertension chronic lower extremity edema who was in the hospital in May 2016 for C. difficile colitis discharged to [facility]  healthcare  rehabilitation brought back secondary to hypothermia and decreased by mouth intake.  *. Sepsis-with hypothermia and hypotension. -in ICU,  hasn't required any pressors.. Continue fluid boluses to keep blood pressure elevated as needed. -Blood pressure improved this morning. Cultures are pending, continue IV Rocephin -No diarrhea at this time, continue contact isolation due to recent C. difficile. Also on Flagyl as cannot take oral dificid at this time.  *. Poor oral intake-unable to clear her own secretions at this time. Suction as needed. -Speech therapy consult -Hold all oral medications at this time.  *. Dementia- recent worsening of dementia. Family at bedside. -With poor oral intake could be considered failure to thrive. -Palliative care consult requested. -No behavioral disturbance at this time.  *. Hypertension-hold metoprolol and enalapril due to hypotension.  *. DVT prophylaxis-start subcutaneous heparin.   All the records are reviewed and case discussed with Care Management/Social Workerr. Management plans discussed with the patient, family and they are in agreement.  CODE STATUS: LIMITED CODE  TOTAL CRITICAL CARE TIME SPENT IN TAKING CARE OF THIS PATIENT: 38 minutes.   POSSIBLE D/C IN 2-3 DAYS, DEPENDING ON CLINICAL CONDITION.   Enid Baas M.D on 07/04/2014 at 10:53 AM  Between 7am to 6pm - Pager - [phone]  After 6pm go to [email or web address] - password [email or web address] [facility]  [place] [facility]  Office  [phone]  CC: Primary care physician; Arlyss Queen, MD

## 2014-07-04 NOTE — Care Management Note (Signed)
Case Management Note  Patient Details  Name: Connie Barr MRN: [id number] Date of Birth: 1922-06-17  Subjective/Objective:   Patient presents from [facility] due to hypotheria, hypotension. Palliative to consult for goals of care. CSW made aware [name]                 Action/Plan:   Expected Discharge Date:                  Expected Discharge Plan:     In-House Referral:  Clinical Social Work  Discharge planning Services     Post Acute Care Choice:    Choice offered to:     DME Arranged:    DME Agency:     HH Arranged:    HH Agency:     Status of Service:     Medicare Important Message Given:  Yes Date Medicare IM Given:  07/04/14 Medicare IM give by:  Gweneth Dimitri Date Additional Medicare IM Given:    Additional Medicare Important Message give by:     If discussed at Long Length of Stay Meetings, dates discussed:    Additional Comments:  Marily Memos, RN 07/04/2014, 11:32 AM

## 2014-07-04 NOTE — Plan of Care (Signed)
Problem: Phase I Progression Outcomes Goal: OOB as tolerated unless otherwise ordered Outcome: Not Met (add Reason) Patient is bedridden and not able to walk

## 2014-07-04 NOTE — Clinical Social Work Note (Signed)
CSW consult received due to patient being from [facility]. Patient was recently placed at [facility] for rehab. Currently, Palliative Care is pending for patient. FL2 placed on patient's chart. Full assessment to follow. York Spaniel MSW,LCSWA [phone]

## 2014-07-04 NOTE — Progress Notes (Signed)
Pt serum glucose 45 this AM; per hypoglycemia protocol, 25g D50 IV push given.  FSBS 139 upon recheck.  Will continue to monitor.

## 2014-07-04 NOTE — Progress Notes (Signed)
Patient's niece Alric Seton in for visit and admission completed. Dr Phifer in to talk with Alric Seton regarding patient's status.

## 2014-07-04 NOTE — Progress Notes (Signed)
Dr. Nemiah Commander in to see patient and informed her patient now requires oral suctioning as she has a large amount of white foamy oral secretions she is not able to swallow as well as she cannot take her medications by mouth. Patient has not   Voided this shift. Order received for a foley catheter

## 2014-07-04 NOTE — Consult Note (Signed)
I met with pt's nieces, Connie Barr and Connie Barr, who are pt's shared decision-makers. Updated them on pt's current condition. Nieces confirm a decline in pt's clinical status over the past 6 months with a more acute decline since going to SNF. They recognize that she may not return to her previous baseline following this acute illness.   We discussed code status and family are now in agreement with DNR. Order entered. We also discussed other aggressive measures such as feeding tube and family would not agree with this.   Family agree with current treatment over the next 24 hrs to see if pt improves. However, if she does not have significant improvement, they would consider transition to comfort care with the possibility of going to the [facility].   Family expressed appreciation for meeting. All questions answered.

## 2014-07-04 NOTE — Consult Note (Signed)
Palliative Medicine Inpatient Consult Note   Name: Connie Barr Date: 07/04/2014 MRN: [id number]  DOB: 16-Jan-1922  Referring Physician: Enid Baas, MD  Palliative Care consult requested for this 81 y.o. female for goals of medical therapy in patient with dementia admitted with altered mental status  Connie Barr is a 81 yo woman with PMH of dementia, HTN, C.diff (05/2014), chronic LE edema. She was admitted 07/03/14 with altered mental status. She was found to be hypothermic (86.7) and bradycardic (43 bpm). Head CT, blood and urine cultures thus far negative. Pt just completed tx for C.diff. No diarrhea since admission for testing. At present, pt is sitting up in bed. ST working with her but pt not following any commands. Family not present.   REVIEW OF SYSTEMS:  Patient is not able to provide ROS   SOCIAL HISTORY:Pt is a resident of [facility] where she went following recent hospitalization. Her decision-makers are nieces Blenda Bridegroom and Theresia Lo.  reports that she has never smoked. She does not have any smokeless tobacco history on file. She reports that she does not drink alcohol.  CODE STATUS: Limited code - DNI  PAST MEDICAL HISTORY: Past Medical History  Diagnosis Date  . Hypertension   . Peripheral edema   . Dementia   . C. difficile colitis   . Hypothermia     PAST SURGICAL HISTORY:  Past Surgical History  Procedure Laterality Date  . Pelvic tumor      removed in the 1960s    ALLERGIES:  has No Known Allergies.  MEDICATIONS:  Current Facility-Administered Medications  Medication Dose Route Frequency Provider Last Rate Last Dose  . 0.9 %  sodium chloride infusion   Intravenous Continuous Adrian Saran, MD 75 mL/hr at 07/04/14 1038    . acetaminophen (TYLENOL) tablet 650 mg  650 mg Oral Q6H PRN Adrian Saran, MD       Or  . acetaminophen (TYLENOL) suppository 650 mg  650 mg Rectal Q6H PRN Adrian Saran, MD      . acetaminophen (TYLENOL) tablet 500-1,000 mg  500-1,000 mg Oral  Q6H PRN Adrian Saran, MD      . alum & mag hydroxide-simeth (MAALOX/MYLANTA) 200-200-20 MG/5ML suspension 30 mL  30 mL Oral Q6H PRN Sital Mody, MD      . cefTRIAXone (ROCEPHIN) 1 g in dextrose 5 % 50 mL IVPB - Premix  1 g Intravenous Q24H Sital Mody, MD      . dorzolamide-timolol (COSOPT) 22.3-6.8 MG/ML ophthalmic solution 1 drop  1 drop Both Eyes BID Adrian Saran, MD   1 drop at 07/04/14 1002  . enoxaparin (LOVENOX) injection 40 mg  40 mg Subcutaneous Q24H Bertram Savin, [facility]      . metroNIDAZOLE (FLAGYL) IVPB 500 mg  500 mg Intravenous Q8H Enid Baas, MD      . ondansetron (ZOFRAN) tablet 4 mg  4 mg Oral Q6H PRN Adrian Saran, MD       Or  . ondansetron (ZOFRAN) injection 4 mg  4 mg Intravenous Q6H PRN Sital Mody, MD      . sodium chloride 0.9 % bolus 1,000 mL  1,000 mL Intravenous PRN Oralia Manis, MD   1,000 mL at 07/04/14 0705    Vital Signs: BP 135/61 mmHg  Pulse 72  Temp(Src) 97.9 F (36.6 C) (Rectal)  Resp 19  Wt 78 kg (171 lb 15.3 oz)  SpO2 98% Filed Weights   07/03/14 1537 07/03/14 2315  Weight: 80.287 kg (177 lb) 78 kg (  171 lb 15.3 oz)    Estimated body mass index is 30.47 kg/(m^2) as calculated from the following:   Height as of 06/02/14:  (1.6 m).   Weight as of this encounter: 78 kg (171 lb 15.3 oz).   PHYSICAL EXAM:  General: ill-appearing HEENT: OP clear, moist oral mucosa Neck: Trachea midline  Cardiovascular: regular rate and rhythm Pulmonary/Chest: coarse BS's ant fields Abdominal: Soft, nontender, hypoactive bowel sounds GU: No suprapubic tenderness Extremities: 3+ edema BLE's, feet > legs Neurological: awake, does not follow commands, nonverbal Skin: scaling over heels both feet Psychiatric: unable to assess  LABS: CBC:  Recent Labs Lab 07/03/14 1722 07/04/14 0103  WBC 4.8 5.1  HGB 10.8* 10.3*  HCT 32.2* 31.9*  PLT 231 228   Comprehensive Metabolic Panel:  Recent Labs Lab 07/03/14 1722 07/04/14 0103 07/04/14 0532  NA 133*  --   135  K 4.3  --  4.2  CL 102  --  105  CO2 27  --  25  GLUCOSE 84  --  45*  BUN 22*  --  24*  CREATININE 0.87 0.94 1.02*  CALCIUM 8.9  --  8.2*  AST  --   --  42*  ALT  --   --  47  ALKPHOS  --   --  108  BILITOT  --   --  0.3    IMPRESSION: Connie Barr is a 81 yo woman with PMH of dementia, HTN, C.diff (05/2014), chronic LE edema. She was admitted 07/03/14 with altered mental status. She was found to be hypothermic (86.7) and bradycardic (43 bpm). Head CT, blood and urine cultures thus far negative. Pt just completed tx for C.diff. No diarrhea since admission for testing.   ST trying to work with pt but pt is unable to participate. Does not swallow what is placed in her mouth. On my exam, she does not follow any commands and does not answer questions.  I called pt's nieces, Theresia Lo [phone]) and Blenda Bridegroom [phone]). No answer, message left. I called [facility] but was unable to locate staff who knows pt.    PLAN: As above  REFERRALS TO BE ORDERED:  Chaplain   More than 50% of the visit was spent in counseling/coordination of care: YES  Time spent: 70 minutes

## 2014-07-05 LAB — CBC
HEMATOCRIT: 29.2 % — AB (ref 35.0–47.0)
Hemoglobin: 9.8 g/dL — ABNORMAL LOW (ref 12.0–16.0)
MCH: 29.9 pg (ref 26.0–34.0)
MCHC: 33.4 g/dL (ref 32.0–36.0)
MCV: 89.7 fL (ref 80.0–100.0)
PLATELETS: 275 10*3/uL (ref 150–440)
RBC: 3.26 MIL/uL — AB (ref 3.80–5.20)
RDW: 16 % — AB (ref 11.5–14.5)
WBC: 9 10*3/uL (ref 3.6–11.0)

## 2014-07-05 LAB — GLUCOSE, CAPILLARY
GLUCOSE-CAPILLARY: 79 mg/dL (ref 65–99)
Glucose-Capillary: 106 mg/dL — ABNORMAL HIGH (ref 65–99)
Glucose-Capillary: 69 mg/dL (ref 65–99)
Glucose-Capillary: 71 mg/dL (ref 65–99)
Glucose-Capillary: 76 mg/dL (ref 65–99)
Glucose-Capillary: 96 mg/dL (ref 65–99)

## 2014-07-05 LAB — PHOSPHORUS: Phosphorus: 3 mg/dL (ref 2.5–4.6)

## 2014-07-05 LAB — URINE CULTURE: Culture: NO GROWTH

## 2014-07-05 LAB — BASIC METABOLIC PANEL
ANION GAP: 7 (ref 5–15)
BUN: 24 mg/dL — AB (ref 6–20)
CALCIUM: 8.4 mg/dL — AB (ref 8.9–10.3)
CHLORIDE: 110 mmol/L (ref 101–111)
CO2: 24 mmol/L (ref 22–32)
Creatinine, Ser: 1.36 mg/dL — ABNORMAL HIGH (ref 0.44–1.00)
GFR calc Af Amer: 38 mL/min — ABNORMAL LOW (ref >60)
GFR calc non Af Amer: 33 mL/min — ABNORMAL LOW (ref >60)
GLUCOSE: 79 mg/dL (ref 65–99)
Potassium: 3.9 mmol/L (ref 3.5–5.1)
Sodium: 141 mmol/L (ref 135–145)

## 2014-07-05 LAB — MAGNESIUM: MAGNESIUM: 1.5 mg/dL — AB (ref 1.7–2.4)

## 2014-07-05 MED ORDER — HYDRALAZINE HCL 20 MG/ML IJ SOLN
10.0000 mg | INTRAMUSCULAR | Status: DC | PRN
  Administered 2014-07-06: 10 mg via INTRAVENOUS
  Filled 2014-07-05: qty 1

## 2014-07-05 MED ORDER — DEXTROSE 50 % IV SOLN
INTRAVENOUS | Status: AC
  Filled 2014-07-05: qty 50

## 2014-07-05 MED ORDER — DEXTROSE-NACL 5-0.9 % IV SOLN
INTRAVENOUS | Status: DC
  Administered 2014-07-05 (×3): via INTRAVENOUS

## 2014-07-05 MED ORDER — DEXTROSE 50 % IV SOLN
50.0000 mL | Freq: Once | INTRAVENOUS | Status: AC
  Administered 2014-07-05: 50 mL via INTRAVENOUS

## 2014-07-05 NOTE — Consult Note (Signed)
Palliative Medicine Inpatient Consult Follow Up Note   Name: Connie Barr Date: 07/05/2014 MRN: [id number]  DOB: Apr 25, 1922  Referring Physician: Enid Baas, MD  Palliative Care consult requested for this 81 y.o. female for goals of medical therapy in patient with dementia admitted with altered mental status  Connie Barr is more alert today. Very combative earlier this AM, calmer now. ST trying to work with her. Family not present.     REVIEW OF SYSTEMS:  Patient is not able to provide ROS  CODE STATUS: DNR   PAST MEDICAL HISTORY: Past Medical History  Diagnosis Date  . Hypertension   . Peripheral edema   . Dementia   . C. difficile colitis   . Hypothermia     PAST SURGICAL HISTORY:  Past Surgical History  Procedure Laterality Date  . Pelvic tumor      removed in the 1960s    Vital Signs: BP 125/104 mmHg  Pulse 64  Temp(Src) 96.8 F (36 C) (Rectal)  Resp 19  Ht 5\' 8"  (1.727 m)  Wt 85.3 kg (188 lb 0.8 oz)  BMI 28.60 kg/m2  SpO2 100% Filed Weights   07/03/14 1537 07/03/14 2315 07/04/14 1400  Weight: 80.287 kg (177 lb) 78 kg (171 lb 15.3 oz) 85.3 kg (188 lb 0.8 oz)    Estimated body mass index is 28.6 kg/(m^2) as calculated from the following:   Height as of this encounter: 5\' 8"  (1.727 m).   Weight as of this encounter: 85.3 kg (188 lb 0.8 oz).  PHYSICAL EXAM: Generall: ill appearing HEENT: OP clear, poor dentition Neck: Trachea midline  Cardiovascular: regular rate and rhythm Pulmonary/Chest: clear ant fields Abdominal: Soft, hypoactive bowel sounds GU: Foley present, clear yellow urine Extremities: 3+ edema LLE > RLE Neurological: awake, moves extremities, mumbles words, some intelligible, does not follow commands Skin: no rashes Psychiatric: alert, calm   LABS: CBC:    Component Value Date/Time   WBC 9.0 07/05/2014 0549   WBC 9.4 08/08/2011 0526   HGB 9.8* 07/05/2014 0549   HGB 10.6* 08/08/2011 0526   HCT 29.2* 07/05/2014 0549   HCT 30.1*  08/08/2011 0526   PLT 275 07/05/2014 0549   PLT 227 08/08/2011 0526   MCV 89.7 07/05/2014 0549   MCV 88 08/08/2011 0526   NEUTROABS 3.7 07/03/2014 1722   NEUTROABS 6.8* 08/08/2011 0526   LYMPHSABS 0.7* 07/03/2014 1722   LYMPHSABS 1.0 08/08/2011 0526   MONOABS 0.4 07/03/2014 1722   MONOABS 1.5* 08/08/2011 0526   EOSABS 0.0 07/03/2014 1722   EOSABS 0.1 08/08/2011 0526   BASOSABS 0.0 07/03/2014 1722   BASOSABS 0.0 08/08/2011 0526   Comprehensive Metabolic Panel:    Component Value Date/Time   NA 141 07/05/2014 0549   NA 127* 08/08/2011 0526   K 3.9 07/05/2014 0549   K 3.7 08/08/2011 0526   CL 110 07/05/2014 0549   CL 92* 08/08/2011 0526   CO2 24 07/05/2014 0549   CO2 26 08/08/2011 0526   BUN 24* 07/05/2014 0549   BUN 28* 08/08/2011 0526   CREATININE 1.36* 07/05/2014 0549   CREATININE 1.28 08/08/2011 0526   GLUCOSE 79 07/05/2014 0549   GLUCOSE 85 08/08/2011 0526   CALCIUM 8.4* 07/05/2014 0549   CALCIUM 9.0 08/08/2011 0526   AST 42* 07/04/2014 0532   AST 24 08/06/2011 2311   ALT 47 07/04/2014 0532   ALT 20 08/06/2011 2311   ALKPHOS 108 07/04/2014 0532   ALKPHOS 88 08/06/2011 2311   BILITOT 0.3 07/04/2014  0532   PROT 5.2* 07/04/2014 0532   PROT 7.4 08/06/2011 2311   ALBUMIN 2.5* 07/04/2014 0532   ALBUMIN 3.8 08/06/2011 2311    IMPRESSION: Connie Barr is a 81 yo woman with PMH of dementia, HTN, C.diff (05/2014), chronic LE edema. She was admitted 07/03/14 with altered mental status. She was found to be hypothermic (86.7) and bradycardic (43 bpm). Head CT, blood and urine cultures thus far negative. Pt just completed tx for C.diff. No diarrhea since admission for testing.   Pt's mental status is improved today. However, still not swallowing when ST worked with her. Doubt she can take in enough po to sustain life. Will meet with niece this afternoon to discuss further.   PLAN: Family meeting  REFERRALS TO BE ORDERED:  Chaplain   More than 50% of the visit was spent in  counseling/coordination of care: YES  Time spent: 35 minutes

## 2014-07-05 NOTE — Clinical Documentation Improvement (Addendum)
Supporting Information: AMS secondary to hypothermia and underlying infection per 6/20 progress notes.   Possible Clinical Condition: . Document the etiology of the altered mental status as: --Coma --Confusion/delirium (including drug-induced) --Drowsiness/somnolence --Stupor/semi-coma --Transient alteration of awareness --Encephalopathy Alcoholic Anoxic/hypoxic Drug-induced/toxic (specify drug) Hepatic Hypertensive Hypoglycemic Metabolic/septic Traumatic/post-concussion Wernicke Other (specify) . Document any associated diagnoses/conditions     Thank Gabriel Cirri Documentation Specialist [phone] [email or web address]   Due to encephalopathy

## 2014-07-05 NOTE — Progress Notes (Signed)
Patient with persistent low blood sugars.  Given 25 grams D50 at 1930 and again at 2350 for blood sugars in 50s.  Spoke to MD concerning this.  Fluids changed to D5NS.  Will continue to monitor.

## 2014-07-05 NOTE — Progress Notes (Signed)
[facility] [place] [facility]   PATIENT NAME: Connie Barr    MR#:  [id number]  DATE OF BIRTH:  Apr 10, 1922  SUBJECTIVE:  CHIEF COMPLAINT:   Chief Complaint  Patient presents with  . Altered Mental Status   - More awake today, speaking somewhat suspect most of them not making meaningful conversation. -Agitated this morning trying to hit the nurse in pinch. -Failed swallow evaluation again as she was pooling stuff in her mouth.   RE.VIEW OF SYSTEMS:  Review of Systems  Unable to perform ROS: dementia    DRUG ALLERGIES:  No Known Allergies  VITALS:  Blood pressure 167/75, pulse 61, temperature 96.4 F (35.8 C), temperature source Rectal, resp. rate 14, height  (1.727 m), weight 85.3 kg (188 lb 0.8 oz), SpO2 100 %.  PHYSICAL EXAMINATION:  Physical Exam  GENERAL:  81 y.o.-year-old patient lying in the bed, thick secretions and unable to clear them.  ill appearing  EYES: Pupils equal, round, reactive to light and accommodation. No scleral icterus. Extraocular muscles intact.  HEENT: Head atraumatic, normocephalic. Oropharynx with lots of mucoid secretions and nasopharynx clear.  NECK:  Supple, no jugular venous distention. No thyroid enlargement, no tenderness.  LUNGS: Normal breath sounds bilaterally, no wheezing, rales,rhonchi or crepitation. Decreased bibasilar breath sounds. No use of accessory muscles of respiration.  CARDIOVASCULAR: S1, S2 normal. Systolic murmur present, no rubs or gallops. ABDOMEN: Soft, nontender, nondistended. Bowel sounds present. No organomegaly or mass.  EXTREMITIES: 3+ pedal edema of both lower extremities, no cyanosis, or clubbing.  NEUROLOGIC: Cranial nerves II through XII are intact. Patient's not following any commands. Only responding to her name. Lower extremities seem like she is bedbound. PSYCHIATRIC: The patient is alert and not oriented.Non verbal to me today.  SKIN: No obvious rash, lesion, or ulcer.     LABORATORY PANEL:   CBC  Recent Labs Lab 07/05/14 0549  WBC 9.0  HGB 9.8*  HCT 29.2*  PLT 275   ------------------------------------------------------------------------------------------------------------------  Chemistries   Recent Labs Lab 07/04/14 0532 07/05/14 0549  NA 135 141  K 4.2 3.9  CL 105 110  CO2 25 24  GLUCOSE 45* 79  BUN 24* 24*  CREATININE 1.02* 1.36*  CALCIUM 8.2* 8.4*  MG  --  1.5*  AST 42*  --   ALT 47  --   ALKPHOS 108  --   BILITOT 0.3  --    ------------------------------------------------------------------------------------------------------------------  Cardiac Enzymes  Recent Labs Lab 07/03/14 1722  TROPONINI 0.03   ------------------------------------------------------------------------------------------------------------------  RADIOLOGY:  Ct Head Wo Contrast  07/03/2014   CLINICAL DATA:  Altered mental status.  Baseline dementia.  EXAM: CT HEAD WITHOUT CONTRAST  TECHNIQUE: Contiguous axial images were obtained from the base of the skull through the vertex without intravenous contrast.  COMPARISON:  06/01/2014  FINDINGS: Sinuses/Soft tissues: Minimal left maxillary sinus mucosal thickening. Other paranasal sinuses and mastoid air cells clear.  Intracranial: Mild low density in the periventricular white matter likely related to small vessel disease. Expected cerebral volume loss for age. Left vertebral carotid atherosclerosis. No mass lesion, hemorrhage, hydrocephalus, acute infarct, intra-axial, or extra-axial fluid collection.  IMPRESSION: 1.  No acute intracranial abnormality. 2. Mild small vessel ischemic change. 3. Minimal sinus disease.   Electronically Signed   By: Jeronimo Greaves M.D.   On: 07/03/2014 16:19    EKG:   Orders placed or performed during the hospital encounter of 07/03/14  . ED EKG  . ED EKG  .  EKG 12-Lead  . EKG 12-Lead    ASSESSMENT AND PLAN:   81 year old with past medical history significant for  dementia, hypertension chronic lower extremity edema who was in the hospital in May 2016 for C. difficile colitis discharged to [facility] healthcare rehabilitation brought back secondary to hypothermia and decreased by mouth intake.  *. Sepsis-with hypothermia and hypotension. -in ICU,  hasn't required any pressors.. Continue fluid boluses to keep blood pressure elevated as needed. -Blood pressure improved now. Cultures are pending, continue IV Rocephin -No diarrhea at this time, continue contact isolation due to recent C. Difficile. - Also on Flagyl as cannot take oral dificid at this time.  *. Poor oral intake-unable to clear her own secretions at this time. Suction as needed. -Speech therapy consulted- needs to remain nothing by mouth as not swallowing. -Hold all oral medications at this time.  *. Hypoglycemia-from poor oral intake. Continue D5.  *. Dementia- recent worsening of dementia. Family at bedside. -With poor oral intake could be considered failure to thrive. -Palliative care consult requested. Recommend consideration to hospice home. -Some behavioral disturbance noted at this time.  *. Hypertension-hold metoprolol and enalapril due to hypotension.  *. DVT prophylaxis- on Lovenox  All the records are reviewed and case discussed with Care Management/Social Workerr. Management plans discussed with the patient, family and they are in agreement.  CODE STATNOT RESUSCITATE   TOTAL TIME SPENT IN TAKING CARE OF THIS PATIENT: 38 minutes.   POSSIBLE D/C AFTER PALLIATIVE CARE CONSULTATION , DEPENDING ON CLINICAL CONDITION.   Enid Baas M.D on 07/05/2014 at 2:08 PM  Between 7am to 6pm - Pager - [phone]  After 6pm go to [email or web address] - password [email or web address] [facility]  [place] [facility]  Office  [phone]  CC: Primary care physician; Arlyss Queen, MD

## 2014-07-05 NOTE — Consult Note (Signed)
I spoke with pt's niece, Thomasenia Sales, by phone. Updated her on pt's condition. Family are still in agreement with Hospice Home if pt is appropriate. I will follow up in AM to assess pt's status.

## 2014-07-05 NOTE — Progress Notes (Signed)
Speech Language Pathology Treatment: Dysphagia  Patient Details Name: Connie Barr MRN: [id number] DOB: 1922/04/06 Today's Date: 07/05/2014 Time: 4037-0964 SLP Time Calculation (min) (ACUTE ONLY): 40 min  Assessment / Plan / Recommendation Clinical Impression  Pt does not appear safe w/ oral intake at this time. Pt demo. Mod.+ decreased bolus awareness for intake and swallowing. Pt exhibited oral holding w/ all bolus consistencies and did not demo. A pharyngeal swallow despite max. Cues and ~2 mins. Of time. Suspect pt's presentation may be related to her declined Cognitive status. MD present and agreed w/ assessment and plan for pt to remain NPO w/ oral care for hygiene and stim; assistance from staff; strict aspiration precautions. ST will f/u w/ pt's status tomorrow.    HPI Other Pertinent Information: pt's respiratory status is much improved since yesterday w/ regard to her wet respirations. Pt is breathing more calmly. Mod-max, confusion noted.    Pertinent Vitals Pain Assessment: No/denies pain  SLP Plan  Continue with current plan of care    Recommendations Diet recommendations: NPO (oral stim via swabs) Supervision:  (total assist) Compensations:  (oral care by staff for hygiene and stim) Postural Changes and/or Swallow Maneuvers: Seated upright 90 degrees              General recommendations:  (palliative care consult ongoing) Oral Care Recommendations: Oral care QID;Staff/trained caregiver to provide oral care Follow up Recommendations:  (TBD) Plan: Continue with current plan of care    GO     Truett Mcfarlan 07/05/2014, 2:22 PM

## 2014-07-05 NOTE — Care Management Note (Signed)
Case Management Note  Patient Details  Name: Connie Barr MRN: [id number] Date of Birth: 20-Nov-1922  Subjective/Objective:   Heads up referral called to Dayna Barker with [facility]  for possible hospice home placement. Dr. Harvie Junior meeting with family this afternoon.            Action/Plan:   Expected Discharge Date:                  Expected Discharge Plan:     In-House Referral:  Clinical Social Work  Discharge planning Services     Post Acute Care Choice:    Choice offered to:     DME Arranged:    DME Agency:     HH Arranged:    HH Agency:     Status of Service:     Medicare Important Message Given:  Yes Date Medicare IM Given:  07/04/14 Medicare IM give by:  Gweneth Dimitri Date Additional Medicare IM Given:    Additional Medicare Important Message give by:     If discussed at Long Length of Stay Meetings, dates discussed:    Additional Comments:  Connie Memos, RN 07/05/2014, 3:22 PM

## 2014-07-05 NOTE — Progress Notes (Signed)
On Call MD notified BP 190/101. Orders to be placed to decrease BP. Will continue to assess.   Cloyd Stagers [employer]

## 2014-07-06 DIAGNOSIS — Z515 Encounter for palliative care: Secondary | ICD-10-CM

## 2014-07-06 DIAGNOSIS — R451 Restlessness and agitation: Secondary | ICD-10-CM

## 2014-07-06 DIAGNOSIS — R600 Localized edema: Secondary | ICD-10-CM

## 2014-07-06 DIAGNOSIS — A419 Sepsis, unspecified organism: Secondary | ICD-10-CM | POA: Diagnosis present

## 2014-07-06 DIAGNOSIS — F0390 Unspecified dementia without behavioral disturbance: Secondary | ICD-10-CM

## 2014-07-06 DIAGNOSIS — E162 Hypoglycemia, unspecified: Secondary | ICD-10-CM

## 2014-07-06 DIAGNOSIS — N390 Urinary tract infection, site not specified: Secondary | ICD-10-CM | POA: Diagnosis present

## 2014-07-06 DIAGNOSIS — Z66 Do not resuscitate: Secondary | ICD-10-CM

## 2014-07-06 DIAGNOSIS — R4182 Altered mental status, unspecified: Secondary | ICD-10-CM

## 2014-07-06 DIAGNOSIS — R6520 Severe sepsis without septic shock: Secondary | ICD-10-CM

## 2014-07-06 DIAGNOSIS — I10 Essential (primary) hypertension: Secondary | ICD-10-CM

## 2014-07-06 DIAGNOSIS — R630 Anorexia: Secondary | ICD-10-CM

## 2014-07-06 LAB — GLUCOSE, CAPILLARY
Glucose-Capillary: 110 mg/dL — ABNORMAL HIGH (ref 65–99)
Glucose-Capillary: 114 mg/dL — ABNORMAL HIGH (ref 65–99)

## 2014-07-06 MED ORDER — MORPHINE SULFATE 20 MG/5ML PO SOLN: 5.0000 mg | ORAL | Status: AC | PRN

## 2014-07-06 MED ORDER — LORAZEPAM 1 MG PO TABS: 1.0000 mg | ORAL_TABLET | ORAL | Status: AC | PRN

## 2014-07-06 MED ORDER — HALOPERIDOL 0.5 MG PO TABS: 0.5000 mg | ORAL_TABLET | ORAL | Status: DC | PRN

## 2014-07-06 MED ORDER — MORPHINE SULFATE (CONCENTRATE) 10 MG/0.5ML PO SOLN: 5.0000 mg | ORAL | Status: AC | PRN

## 2014-07-06 MED ORDER — HALOPERIDOL LACTATE 5 MG/ML IJ SOLN: 0.5000 mg | INTRAMUSCULAR | Status: AC | PRN

## 2014-07-06 MED ORDER — HALOPERIDOL 0.5 MG PO TABS: 0.5000 mg | ORAL_TABLET | ORAL | Status: AC | PRN

## 2014-07-06 MED ORDER — HALOPERIDOL LACTATE 2 MG/ML PO CONC: 0.5000 mg | ORAL | Status: AC | PRN

## 2014-07-06 MED ORDER — HALOPERIDOL LACTATE 2 MG/ML PO CONC: 0.5000 mg | ORAL | Status: DC | PRN

## 2014-07-06 MED ORDER — MORPHINE SULFATE (CONCENTRATE) 10 MG/0.5ML PO SOLN: 5.0000 mg | ORAL | Status: DC | PRN

## 2014-07-06 MED ORDER — MORPHINE SULFATE (CONCENTRATE) 10 MG/0.5ML PO SOLN
5.0000 mg | ORAL | Status: DC | PRN
Start: 2014-07-06 — End: 2014-07-06

## 2014-07-06 MED ORDER — HALOPERIDOL LACTATE 5 MG/ML IJ SOLN: 0.5000 mg | INTRAMUSCULAR | Status: DC | PRN

## 2014-07-06 NOTE — Progress Notes (Signed)
Patient is transferring to [place] facility today. Dr. Harvie Junior completed hospice home admit orders. Clinical Child psychotherapist (CSW) completed D/C packet including DNR form and prescriptions. CSW made [facility] liaison aware of above. Clydie Braun will call report and arrange EMS for transport. RN aware of above. No family was at bedside. Please reconsult if future social work needs arise. CSW signing off.   Jetta Lout, LCSWA [phone]

## 2014-07-06 NOTE — Discharge Summary (Signed)
[facility] - [place] at [facility]  DISCHARGE SUMMARY   PATIENT NAME: Connie Barr    MR#:  [id number]  DATE OF BIRTH:  Jan 18, 1922  DATE OF ADMISSION:  07/03/2014 ADMITTING PHYSICIAN: Adrian Saran, MD  DATE OF DISCHARGE: No discharge date for patient encounter.  PRIMARY CARE PHYSICIAN: Arlyss Queen, MD    ADMISSION DIAGNOSIS:  UTI (lower urinary tract infection) [N39.0] Hypothermia, initial encounter [T68.XXXA] Altered mental status, unspecified altered mental status type [R41.82]  DISCHARGE DIAGNOSIS:  Active Problems:   Hypothermia   Altered mental status   Severe sepsis   UTI (urinary tract infection)   SECONDARY DIAGNOSIS:   Past Medical History  Diagnosis Date  . Hypertension   . Peripheral edema   . Dementia   . C. difficile colitis   . Hypothermia     HOSPITAL COURSE:   81 year old with past medical history significant for dementia, hypertension, chronic lower extremity edema who was in the hospital in May 2016 for C. difficile colitis discharged to [facility] brought back secondary to hypothermia and decreased by mouth intake.  *. Sepsis-with hypothermia and hypotension. Initially admitted to the ICU. Did not require pressors. Blood pressure maintained with fluid boluses. Urine culture negative. No blood cultures obtained on admission. No diarrhea and C. difficile negative. At this time blood pressure has improved. Has been treated throughout hospital admission with IV Rocephin.  *. Poor oral intake-unable to clear her own secretions at this time. Speech therapy consulted- needs to remain nothing by mouth as not swallowing.  Holding all oral medications at this time.  *. Hypoglycemia- from poor oral intake. Has been on D5 to keep blood sugars up  *. Dementia- recent worsening of dementia. Not oriented to person or place. Decreased oral intake and lack of swallowing. Behavioral disturbance.  * DO NOT  RESUSCITATE: Palliative care has been following this patient throughout the hospitalization. She is DO NOT RESUSCITATE. With her advanced dementia, inability to protect the airway and manage secretions, decreased oral intake, lack of swallowing reflex she is failing to thrive. She will be discharged to hospice home. Family has been meeting with palliative care and also with Dr. Lawernce Ion study during hospitalization.   DISCHARGE CONDITIONS:   Poor  CONSULTS OBTAINED:    Palliative care, Dr. Harriett Sine Phifer   DRUG ALLERGIES:  No Known Allergies  DISCHARGE MEDICATIONS:   Current Discharge Medication List    START taking these medications   Details  haloperidol (HALDOL) 0.5 MG tablet Take 1 tablet (0.5 mg total) by mouth every 4 (four) hours as needed for agitation (or delirium). Qty: 30 tablet, Refills: 0    haloperidol (HALDOL) 2 MG/ML solution Place 0.3 mLs (0.6 mg total) under the tongue every 4 (four) hours as needed for agitation (or delirium). Qty: 20 mL, Refills: 0    haloperidol lactate (HALDOL) 5 MG/ML injection Inject 0.1 mLs (0.5 mg total) into the vein every 4 (four) hours as needed (or delirium). Qty: 1 mL, Refills: 0    LORazepam (ATIVAN) 1 MG tablet Take 1 tablet (1 mg total) by mouth every hour as needed for anxiety. Qty: 30 tablet, Refills: 0    morphine 20 MG/5ML solution Take 1.3 mLs (5.2 mg total) by mouth every hour as needed for pain. Qty: 30 mL, Refills: 0    !! Morphine Sulfate (MORPHINE CONCENTRATE) 10 MG/0.5ML SOLN concentrated solution Take 0.25 mLs (5 mg total) by mouth every 2 (two) hours as needed for moderate pain (or  dyspnea). Qty: 42 mL, Refills: 0    !! Morphine Sulfate (MORPHINE CONCENTRATE) 10 MG/0.5ML SOLN concentrated solution Place 0.25 mLs (5 mg total) under the tongue every 2 (two) hours as needed for moderate pain (or dyspnea). Qty: 42 mL, Refills: 0     !! - Potential duplicate medications found. Please discuss with provider.    STOP  taking these medications     acetaminophen (TYLENOL) 500 MG tablet      dorzolamide-timolol (COSOPT) 22.3-6.8 MG/ML ophthalmic solution      enalapril (VASOTEC) 20 MG tablet      fidaxomicin (DIFICID) 200 MG TABS tablet      furosemide (LASIX) 20 MG tablet      loperamide (IMODIUM) 2 MG capsule      metoprolol tartrate (LOPRESSOR) 25 MG tablet      Multiple Vitamins-Minerals (CENTRUM SILVER PO)      potassium chloride SA (K-DUR,KLOR-CON) 20 MEQ tablet      QUEtiapine (SEROQUEL) 25 MG tablet          DISCHARGE INSTRUCTIONS:    DIET:  None  DISCHARGE CONDITION:  Serious  ACTIVITY:  Bedrest  OXYGEN:  Home Oxygen: No.   Oxygen Delivery: room air  DISCHARGE LOCATION:  Hospice home   If you experience worsening of your admission symptoms, develop shortness of breath, life threatening emergency, suicidal or homicidal thoughts you must seek medical attention immediately by calling 911 or calling your MD immediately  if symptoms less severe.  You Must read complete instructions/literature along with all the possible adverse reactions/side effects for all the Medicines you take and that have been prescribed to you. Take any new Medicines after you have completely understood and accpet all the possible adverse reactions/side effects.   Please note  You were cared for by a hospitalist during your hospital stay. If you have any questions about your discharge medications or the care you received while you were in the hospital after you are discharged, you can call the unit and asked to speak with the hospitalist on call if the hospitalist that took care of you is not available. Once you are discharged, your primary care physician will handle any further medical issues. Please note that NO REFILLS for any discharge medications will be authorized once you are discharged, as it is imperative that you return to your primary care physician (or establish a relationship with a primary  care physician if you do not have one) for your aftercare needs so that they can reassess your need for medications and monitor your lab values.   Today   CHIEF COMPLAINT:   Chief Complaint  Patient presents with  . Altered Mental Status    HISTORY OF PRESENT ILLNESS:  Chene Kasinger is a 81 y.o. female with a known history of dementia, hypertension with recent admission into the hospital for hypothermia who presents from skilled nursing facility with above complaint. Patient was discharged from our facility to skilled nursing facility. Over the past 2 weeks patient's had a downward spiral according to the family was at bedside. Patient has been more confused, lethargic and has severe diarrhea. She was recently treated for C. difficile colitis area her last day of medications is today. In the emergency department she was noted be hypothermic. She currently has a bear hugger in place. She is very confused. Her baseline dementia has also worsened over several weeks as well.  VITAL SIGNS:  Blood pressure 158/72, pulse 64, temperature 97.6 F (36.4 C), temperature source Oral,  resp. rate 18, height  (1.727 m), weight 85.3 kg (188 lb 0.8 oz), SpO2 96 %.  I/O:    Intake/Output Summary (Last 24 hours) at 07/06/14 1233 Last data filed at 07/05/14 1817  Gross per 24 hour  Intake 684.17 ml  Output    500 ml  Net 184.17 ml    PHYSICAL EXAMINATION:  GENERAL:  81 y.o.-year-old patient lying in the bed with no acute distress.  EYES: Pupils equal, round, reactive to light and accommodation. No scleral icterus. Extraocular muscles intact.  HEENT: Head atraumatic, normocephalic. Oropharynx and nasopharynx clear. Mucous membranes are dry NECK:  Supple, no jugular venous distention. No thyroid enlargement, no tenderness.  LUNGS: Normal breath sounds bilaterally, no wheezing, rales,rhonchi or crepitation. No use of accessory muscles of respiration.  CARDIOVASCULAR: S1, S2 normal. No murmurs,  rubs, or gallops.  ABDOMEN: Soft, non-tender, non-distended. Bowel sounds present. No organomegaly or mass.  EXTREMITIES: 1+ pedal edema, cyanosis, or clubbing.  NEUROLOGIC: Does not participate in neurologic examination  PSYCHIATRIC: Alert, talkative, not oriented  SKIN: No obvious rash, lesion, or ulcer.   DATA REVIEW:   CBC  Recent Labs Lab 07/05/14 0549  WBC 9.0  HGB 9.8*  HCT 29.2*  PLT 275    Chemistries   Recent Labs Lab 07/04/14 0532 07/05/14 0549  NA 135 141  K 4.2 3.9  CL 105 110  CO2 25 24  GLUCOSE 45* 79  BUN 24* 24*  CREATININE 1.02* 1.36*  CALCIUM 8.2* 8.4*  MG  --  1.5*  AST 42*  --   ALT 47  --   ALKPHOS 108  --   BILITOT 0.3  --     Cardiac Enzymes  Recent Labs Lab 07/03/14 1722  TROPONINI 0.03    Microbiology Results  Results for orders placed or performed during the hospital encounter of 07/03/14  Urine culture     Status: None   Collection Time: 07/03/14  5:22 PM  Result Value Ref Range Status   Specimen Description URINE, RANDOM  Final   Special Requests NONE  Final   Culture NO GROWTH 2 DAYS  Final   Report Status 07/05/2014 FINAL  Final  MRSA PCR Screening     Status: None   Collection Time: 07/04/14  7:55 AM  Result Value Ref Range Status   MRSA by PCR NEGATIVE NEGATIVE Final    Comment:        The GeneXpert MRSA Assay (FDA approved for NASAL specimens only), is one component of a comprehensive MRSA colonization surveillance program. It is not intended to diagnose MRSA infection nor to guide or monitor treatment for MRSA infections.     RADIOLOGY:  No results found.  EKG:   Orders placed or performed during the hospital encounter of 07/03/14  . ED EKG  . ED EKG  . EKG 12-Lead  . EKG 12-Lead      Management plans discussed with the patient, family and they are in agreement.  CODE STATUS:  Advance Directive Documentation        Most Recent Value   Type of Advance Directive  Living will   Pre-existing  out of facility DNR order (yellow form or pink MOST form)     "MOST" Form in Place?        TOTAL TIME TAKING CARE OF THIS PATIENT: 35 minutes.    Elby Showers M.D on 07/06/2014 at 12:33 PM  Between 7am to 6pm - Pager - [phone]  After 6pm go  to [email or web address] - password [email or web address] [facility]  [facility]  Office  [phone]  CC: Primary care physician; Arlyss Queen, MD

## 2014-07-06 NOTE — Consult Note (Signed)
Palliative Medicine Inpatient Consult Follow Up Note   Name: Connie Barr Date: 07/06/2014 MRN: [id number]  DOB: September 04, 1922  Referring Physician: Gale Journey, MD  Palliative Care consult requested for this 81 y.o. female for goals of medical therapy in patient with dementia admitted with altered mental status  [name] is lying in bed. Awake, mumbles answers to some questions. Still with intermittent agitation. Continues on D5 for hypoglycemia. Not taking any po. Family not present.  Marland Kitchen    REVIEW OF SYSTEMS:  Patient is not able to provide ROS  CODE STATUS: DNR   PAST MEDICAL HISTORY: Past Medical History  Diagnosis Date  . Hypertension   . Peripheral edema   . Dementia   . C. difficile colitis   . Hypothermia     PAST SURGICAL HISTORY:  Past Surgical History  Procedure Laterality Date  . Pelvic tumor      removed in the 1960s    Vital Signs: BP 158/72 mmHg  Pulse 64  Temp(Src) 97.6 F (36.4 C) (Oral)  Resp 18  Ht  (1.727 m)  Wt 85.3 kg (188 lb 0.8 oz)  BMI 28.60 kg/m2  SpO2 96% Filed Weights   07/03/14 1537 07/03/14 2315 07/04/14 1400  Weight: 80.287 kg (177 lb) 78 kg (171 lb 15.3 oz) 85.3 kg (188 lb 0.8 oz)    Estimated body mass index is 28.6 kg/(m^2) as calculated from the following:   Height as of this encounter:  (1.727 m).   Weight as of this encounter: 85.3 kg (188 lb 0.8 oz).  PHYSICAL EXAM: General: ill appearing HEENT: OP clear, poor dentition Neck: Trachea midline  Cardiovascular: regular rate and rhythm Pulmonary/Chest: clear ant fields Abdominal: Soft, hypoactive bowel sounds GU: Foley present, clear yellow urine Extremities: 3+ edema LLE > RLE Neurological: awake, moves extremities, mumbles words, does not follow commands Skin: no rashes Psychiatric: alert, calm  LABS: CBC:    Component Value Date/Time   WBC 9.0 07/05/2014 0549   WBC 9.4 08/08/2011 0526   HGB 9.8* 07/05/2014 0549   HGB 10.6* 08/08/2011 0526   HCT  29.2* 07/05/2014 0549   HCT 30.1* 08/08/2011 0526   PLT 275 07/05/2014 0549   PLT 227 08/08/2011 0526   MCV 89.7 07/05/2014 0549   MCV 88 08/08/2011 0526   NEUTROABS 3.7 07/03/2014 1722   NEUTROABS 6.8* 08/08/2011 0526   LYMPHSABS 0.7* 07/03/2014 1722   LYMPHSABS 1.0 08/08/2011 0526   MONOABS 0.4 07/03/2014 1722   MONOABS 1.5* 08/08/2011 0526   EOSABS 0.0 07/03/2014 1722   EOSABS 0.1 08/08/2011 0526   BASOSABS 0.0 07/03/2014 1722   BASOSABS 0.0 08/08/2011 0526   Comprehensive Metabolic Panel:    Component Value Date/Time   NA 141 07/05/2014 0549   NA 127* 08/08/2011 0526   K 3.9 07/05/2014 0549   K 3.7 08/08/2011 0526   CL 110 07/05/2014 0549   CL 92* 08/08/2011 0526   CO2 24 07/05/2014 0549   CO2 26 08/08/2011 0526   BUN 24* 07/05/2014 0549   BUN 28* 08/08/2011 0526   CREATININE 1.36* 07/05/2014 0549   CREATININE 1.28 08/08/2011 0526   GLUCOSE 79 07/05/2014 0549   GLUCOSE 85 08/08/2011 0526   CALCIUM 8.4* 07/05/2014 0549   CALCIUM 9.0 08/08/2011 0526   AST 42* 07/04/2014 0532   AST 24 08/06/2011 2311   ALT 47 07/04/2014 0532   ALT 20 08/06/2011 2311   ALKPHOS 108 07/04/2014 0532   ALKPHOS 88 08/06/2011 2311  BILITOT 0.3 07/04/2014 0532   PROT 5.2* 07/04/2014 0532   PROT 7.4 08/06/2011 2311   ALBUMIN 2.5* 07/04/2014 0532   ALBUMIN 3.8 08/06/2011 2311    IMPRESSION: Ms Connie Barr is a 81 yo woman with PMH of dementia, HTN, C.diff (05/2014), chronic LE edema. She was admitted 07/03/14 with altered mental status. She was found to be hypothermic (86.7) and bradycardic (43 bpm). Head CT, blood and urine cultures thus far negative. Pt just completed tx for C.diff. No diarrhea since admission for testing.  Pt has improved minimally since admission. Has periods of agitation. Not taking in any po despite efforts of ST. On D5 for hypoglycemia.   Pt is appropriate for Hospice Home for symptom management and probable end of life care. Hospice Home liason notified. Orders entered.  Discussed with family who are in agreement.   PLAN: Hospice Home  REFERRALS TO BE ORDERED:  Hospice   More than 50% of the visit was spent in counseling/coordination of care: YES  Time spent: 35 minutes

## 2014-07-06 NOTE — Plan of Care (Signed)
Problem: Discharge Progression Outcomes Goal: Discharge plan in place and appropriate Individualization:  Pt prefers to be called Ms. Fabre who is from [employer].  Hx dementia, peripheral edema, & HTn controlled by home medications.  High fall risk due to generalized weakness. Goal: Other Discharge Outcomes/Goals Plan of care progress to goals: 1. No evidence of pain  2. Hemodynamically:              -VSS, BP running high- PRN Hydralazine ordered with parameters to give.              -D5NS IVF continues with stable blood sugars which are checked Q4H             -Q4H neuro checks WDL 3. NPO due to severe aspiration risk 4. Pt remains in bed with turning Q2H

## 2014-07-06 NOTE — Progress Notes (Signed)
Patients daughter Bonita Quin and family arrived to visit with patient. Let Bonita Quin know that they need to try and be here as soon as possible around 3 pm tomorrow on 6/23 to meet with Dr. Harvie Junior. She verbalized understanding and stated that she would be here roughly around that time to talk with her. They seemed agreeable regarding her going to hospice home and let them know that Dr. Harvie Junior would discuss that with them tomorrow. Let them know that for this afternoon she would be transferred to 1C to continue her care until further plans are made.

## 2014-07-06 NOTE — Progress Notes (Signed)
Pt being discharged to hospice home, awaiting EMS arrival for transport, pt with no noted complaints

## 2014-07-06 NOTE — Progress Notes (Signed)
Nutrition Brief Note  Chart reviewed. Pt now transitioning to comfort care with discharge to hospice home orders.  Pt is currently NPO.; may have comfort feeds from the floor. No further nutrition interventions warranted at this time.  Please re-consult as needed.   Leda Quail, [place], LDN Pager [phone]

## 2014-07-06 NOTE — Consult Note (Signed)
Hospice Home Discharge Orders   Patient:  Connie Barr is an 81 y.o., female MRN:  [id number] DOB:  01/19/1922 Patient phone:  [phone] (home)  Patient address:   [address] [place] [address],     Admit: Admit to Hospice Home  Code Status: DNR  Diet: as tolerated  Activity: as tolerated  Oxygen: use for comfort  Foley: Leave foley cath for urinary incontinence/previous skin breakdown   Date of Admission:  07/03/2014   Principal Problem: <principal problem not specified> Discharge Diagnoses: Patient Active Problem List   Diagnosis Date Noted  . Altered mental status [R41.82]   . Hypothermia [T68.XXXA] 06/01/2014      Medications: May crush or use liquid when appropriate. May change to rectal route if unable to swallow.    Medication List    STOP taking these medications        acetaminophen 500 MG tablet  Commonly known as:  TYLENOL     CENTRUM SILVER PO     dorzolamide-timolol 22.3-6.8 MG/ML ophthalmic solution  Commonly known as:  COSOPT     enalapril 20 MG tablet  Commonly known as:  VASOTEC     furosemide 20 MG tablet  Commonly known as:  LASIX     loperamide 2 MG capsule  Commonly known as:  IMODIUM     metoprolol tartrate 25 MG tablet  Commonly known as:  LOPRESSOR     potassium chloride SA 20 MEQ tablet  Commonly known as:  K-DUR,KLOR-CON     QUEtiapine 25 MG tablet  Commonly known as:  SEROQUEL      TAKE these medications        LORazepam 1 MG tablet  Commonly known as:  ATIVAN  Take 1 tablet (1 mg total) by mouth every hour as needed for anxiety.     morphine 20 MG/5ML solution  Take 1.3 mLs (5.2 mg total) by mouth every hour as needed for pain.      ASK your doctor about these medications        fidaxomicin 200 MG Tabs tablet  Commonly known as:  DIFICID  Take 200 mg by mouth 2 (two) times daily.           Comments:    SignedSteele Berg [name] 07/06/2014, 9:08 AM

## 2014-07-06 NOTE — Progress Notes (Signed)
New hospice home referral received following a Palliative Care consult. Ms Connie Barr is a 81 year old woman with PMH of dementia, HTN, C.diff (05/2014), chronic LE edema. She was admitted 07/03/14 with altered mental status. She was found to be hypothermic (86.7) and bradycardic (43 bpm). Head CT, blood and urine cultures negative for infection. Patient has been refusing food and has had agitation at times.  She has required a D5 NS drip for treatment of hypoglycemia, which has now been discontinued as family has chosen to pursue comfort care at the hospice home. Patient seen lying in bed, alert, able to say her name. She was not able to answer any other questions, appeared pleasantly confused. Foley in place draining clear golden urine. Patient continues with no po intake. No family present. Writer spoke via phone to patient's niece Connie Barr [phone]) to initiate education regarding hospice home services, philosophy of and team approach to care. Connie Barr is at her job and will not be able to get to the hospice home until 4 pm to sign consents. Writer to arrange EMS transport for 4 pm. Staff RN Marchelle Folks, [facility] and CSW, and MD all aware of plan for transport to the hospice home via EMS with portable DNR in place. Report called to hospice home, discharge information/orders faxed to referral intake. Thank you for the opportunity to serve [name] and her family. Dayna Barker RN, BSN, [facility] [place], [facility] [phone] c

## 2014-08-14 DEATH — deceased

## 2016-08-01 IMAGING — CT CT HEAD W/O CM
2 series · 16 of 30 positions shown, 19 images · non-contrast
Comparison: 05/06/2009

CLINICAL DATA: Altered mental status

EXAM:
CT HEAD WITHOUT CONTRAST
TECHNIQUE: Contiguous axial images were obtained from the base of the skull
through the vertex without intravenous contrast.

[Series 2: head wo · axial · 0.42mm/px · z∈[+104,+208]mm · 12 of 28 slices shown, 15 images]
[im 3/28  brain]
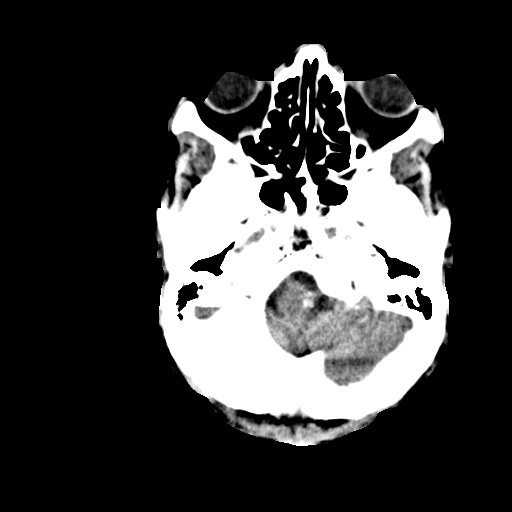
[im 3/28  bone]
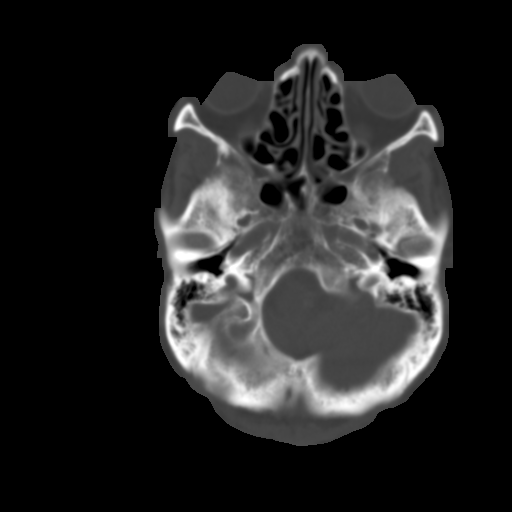
[im 5/28  brain]
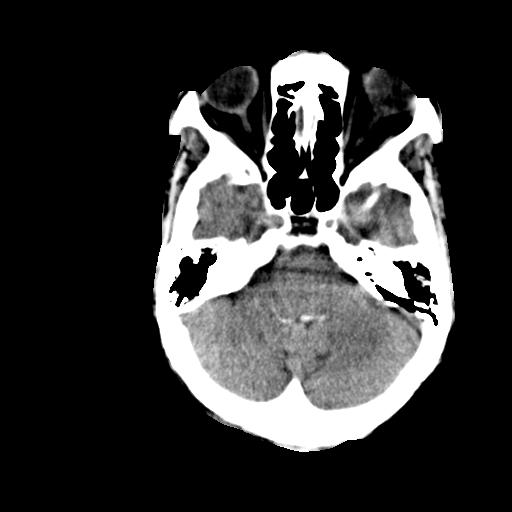
[im 7/28  brain]
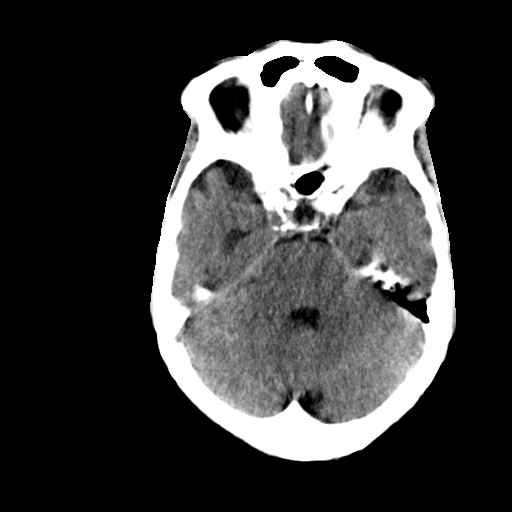
[im 9/28  brain]
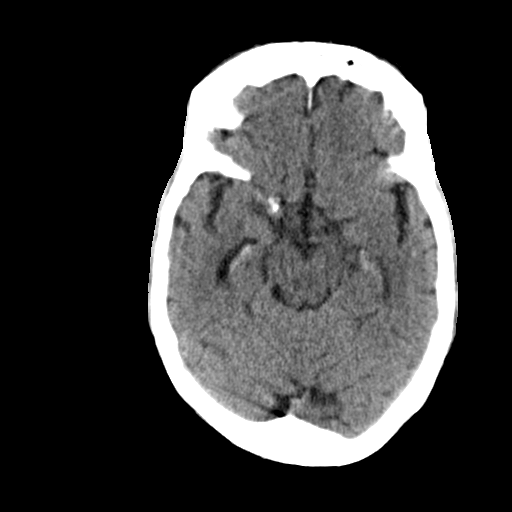
[im 11/28  brain]
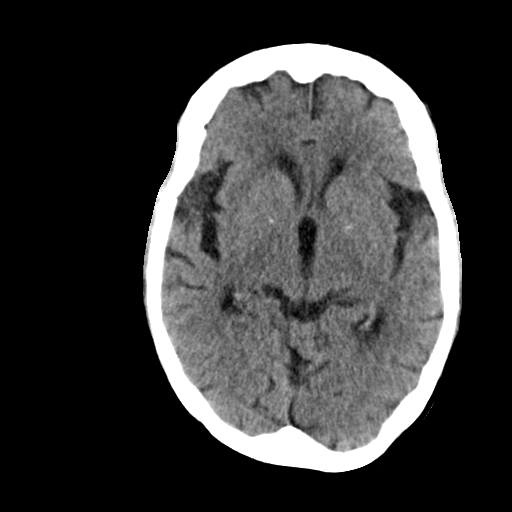
[im 11/28  bone]
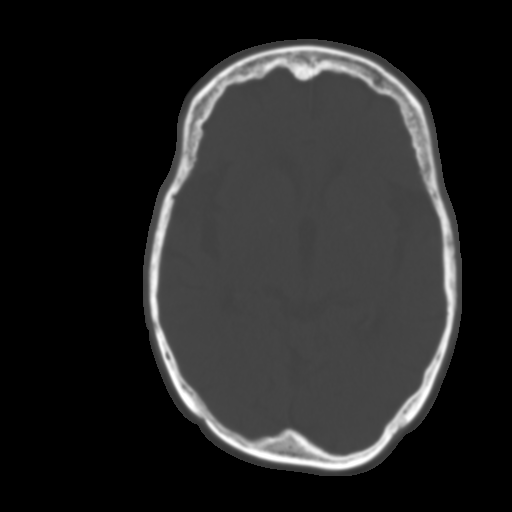
[im 13/28  brain]
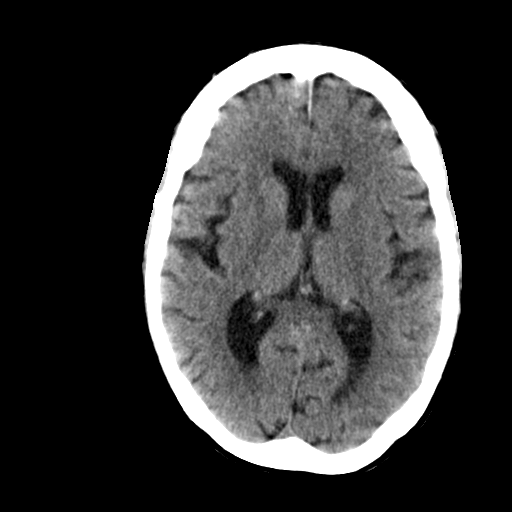
[im 15/28  brain]
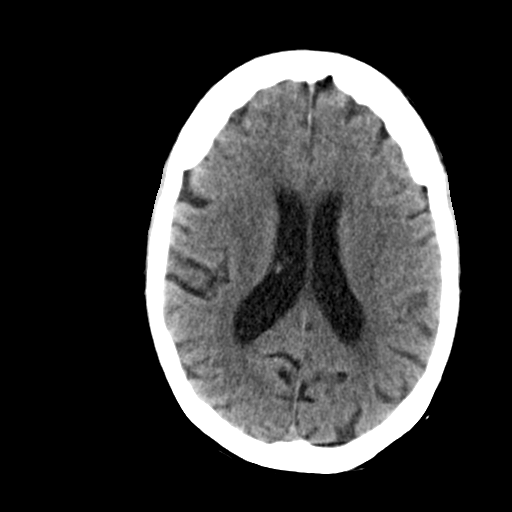
[im 17/28  brain]
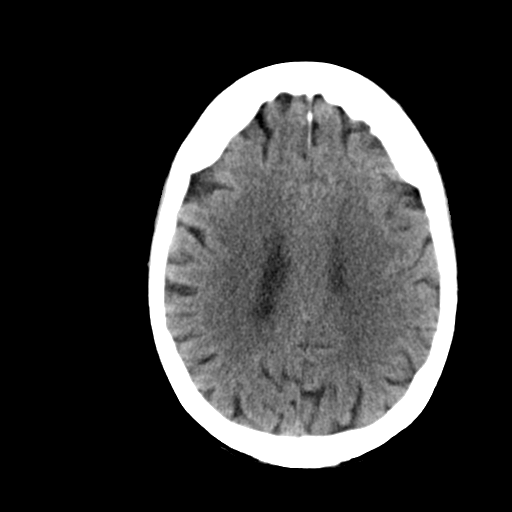
[im 19/28  brain]
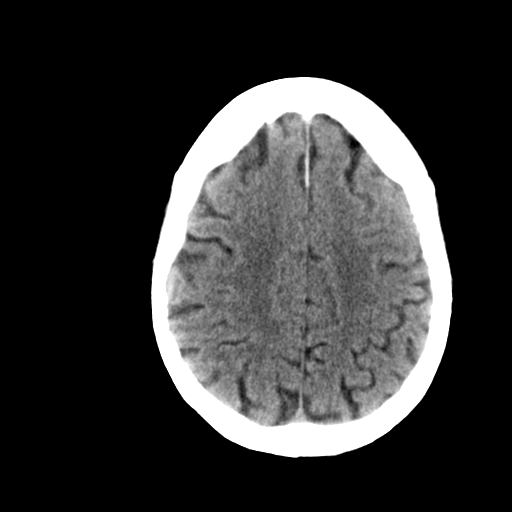
[im 19/28  bone]
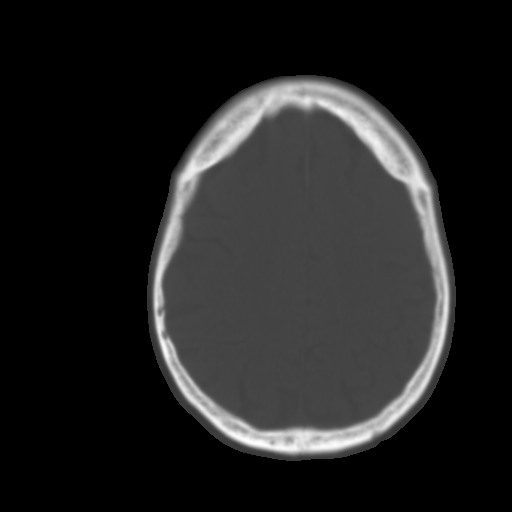
[im 21/28  brain]
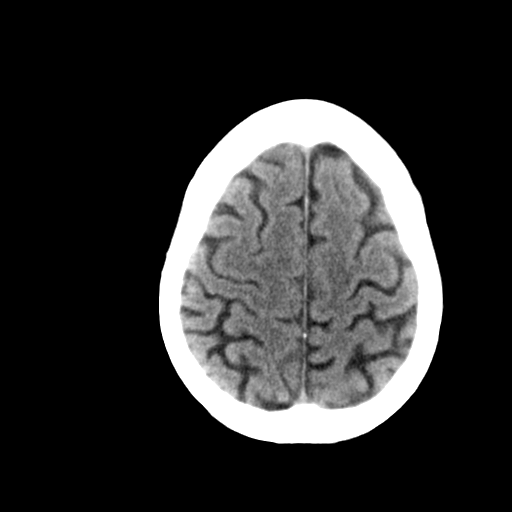
[im 23/28  brain]
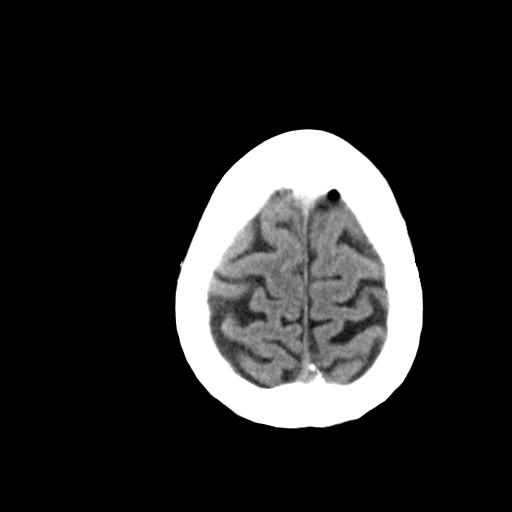
[im 25/28  brain]
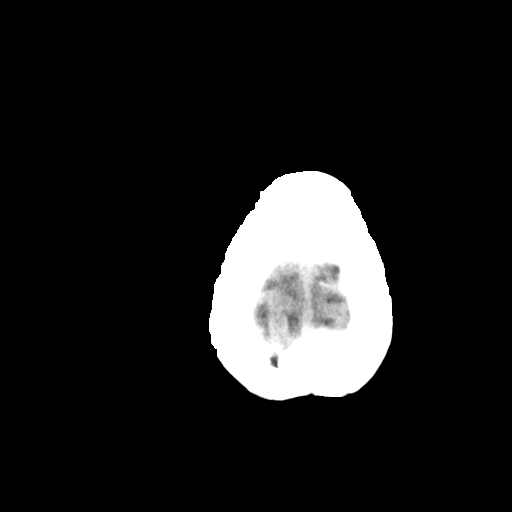

[Series 3: head bone · axial · 0.41mm/px · z∈[+99,+156]mm · 4 of 30 slices shown]
[im 2/30  bone]
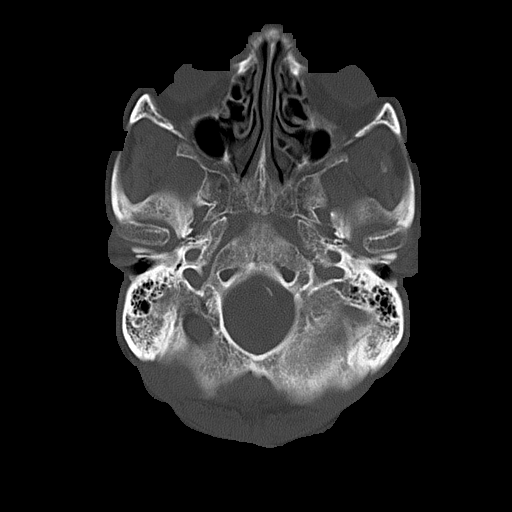
[im 6/30  bone]
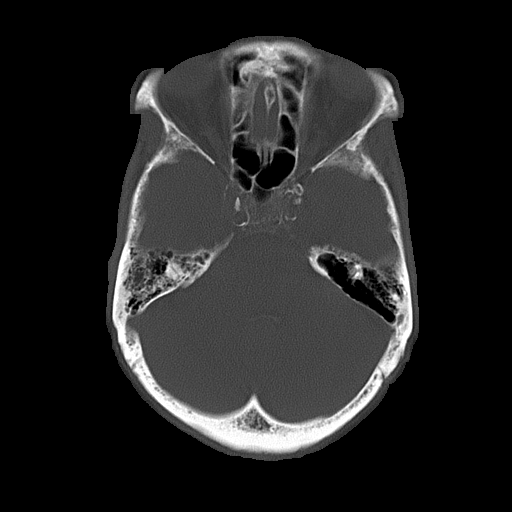
[im 10/30  bone]
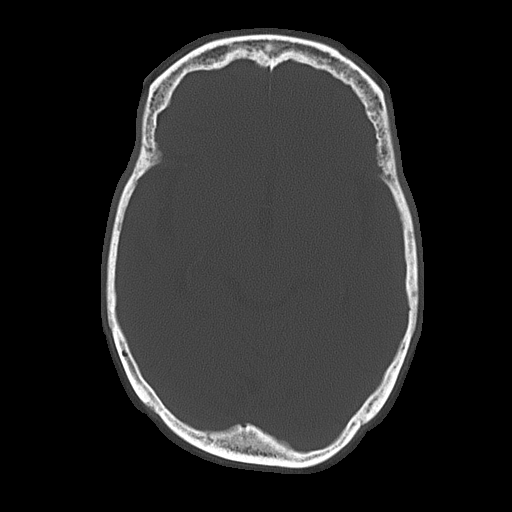
[im 14/30  bone]
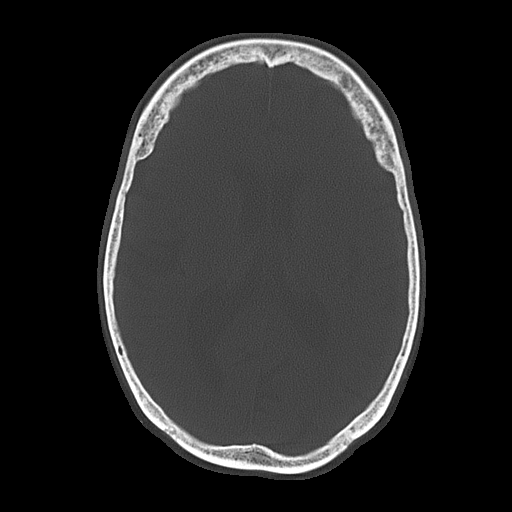

[16 of 30 positions shown; findings below may reference images not displayed]

FINDINGS: There is no evidence of mass effect, midline shift, or extra-axial
fluid collections. There is no evidence of a space-occupying lesion
or intracranial hemorrhage. There is no evidence of a cortical-based
area of acute infarction. There is generalized cerebral atrophy.
There is periventricular white matter low attenuation likely
secondary to microangiopathy.

The ventricles and sulci are appropriate for the patient's age. The
basal cisterns are patent.

Visualized portions of the orbits are unremarkable. The visualized
portions of the paranasal sinuses and mastoid air cells are
unremarkable. Cerebrovascular atherosclerotic calcifications are
noted.

The osseous structures are unremarkable.
IMPRESSION: 1. No acute intracranial pathology.
2. Chronic microvascular disease and cerebral atrophy.

## 2016-09-02 IMAGING — CT CT HEAD W/O CM
1 series · 16 of 30 positions shown, 20 images · non-contrast
Comparison: 06/01/2014

CLINICAL DATA: Altered mental status.  Baseline dementia.

EXAM:
CT HEAD WITHOUT CONTRAST
TECHNIQUE: Contiguous axial images were obtained from the base of the skull
through the vertex without intravenous contrast.

[Series 5: head bone · axial · 0.46mm/px · z∈[+226,+348]mm · 16 of 67 slices shown, 20 images]
[im 3/67  brain]
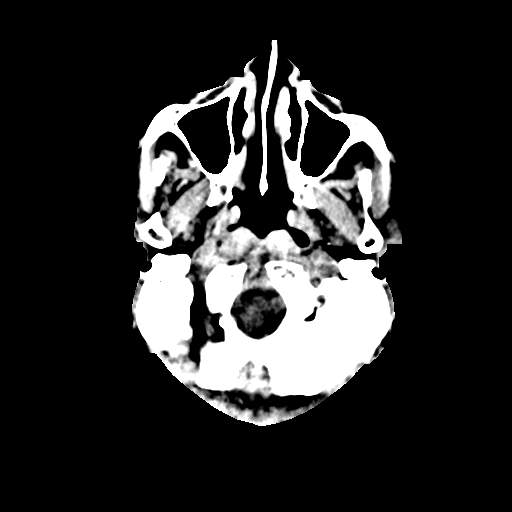
[im 3/67  bone]
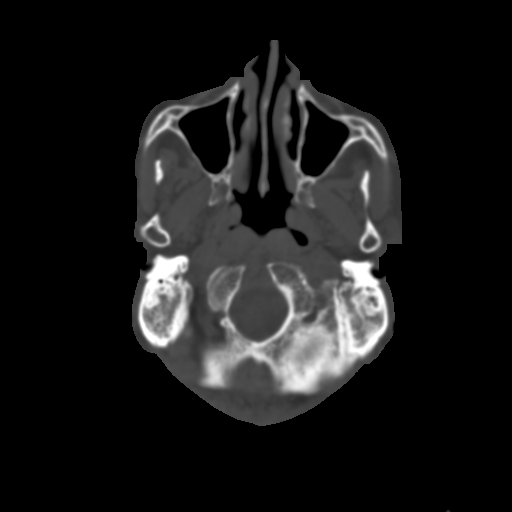
[im 7/67  brain]
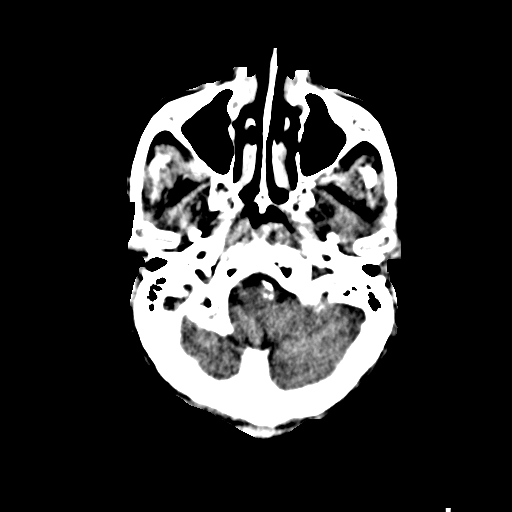
[im 12/67  brain]
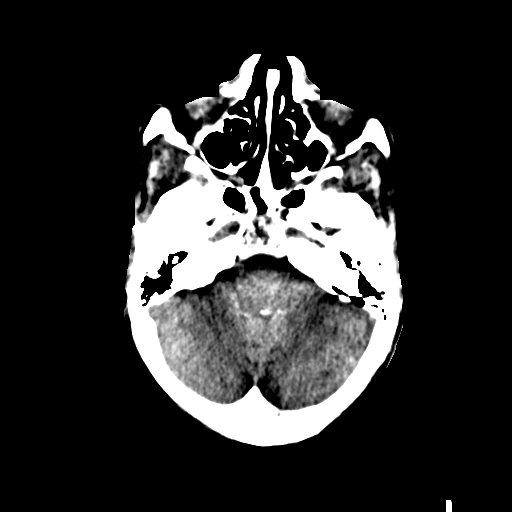
[im 16/67  brain]
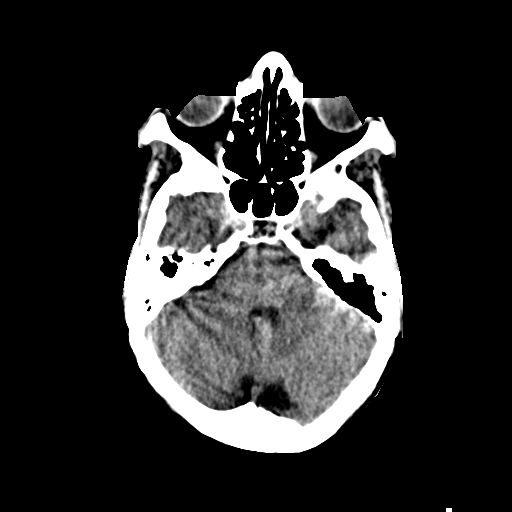
[im 19/67  brain]
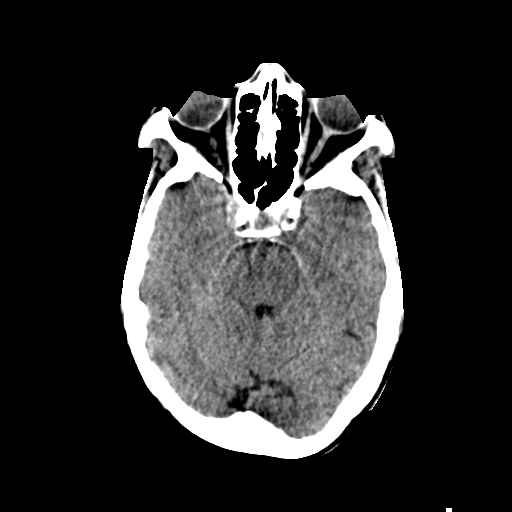
[im 19/67  bone]
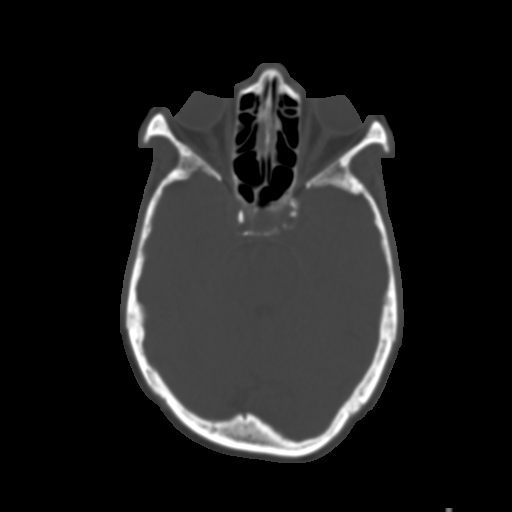
[im 23/67  brain]
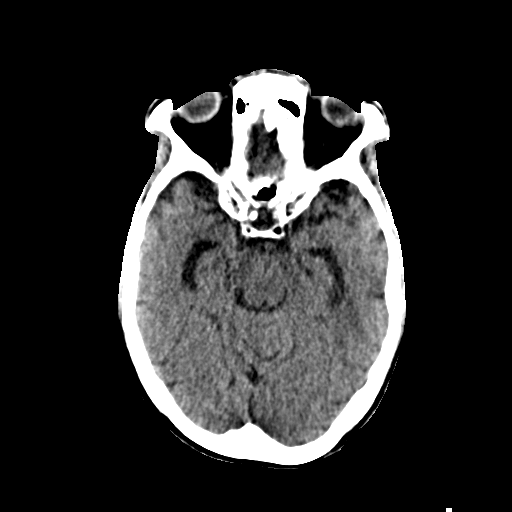
[im 28/67  brain]
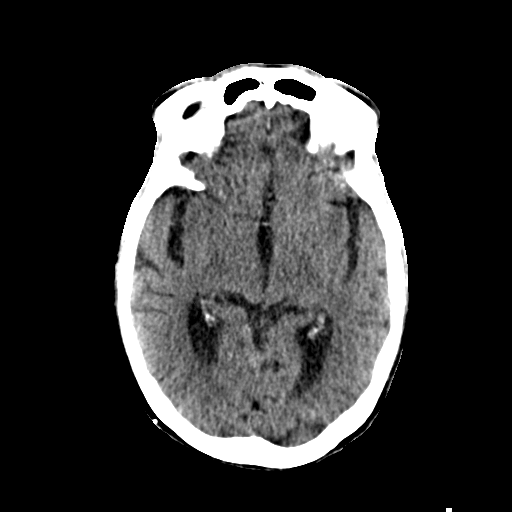
[im 32/67  brain]
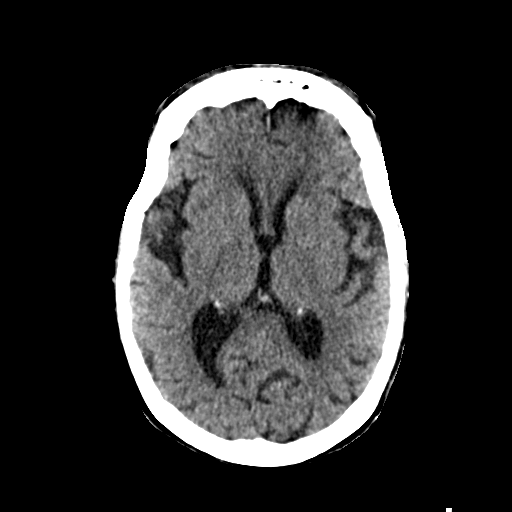
[im 35/67  brain]
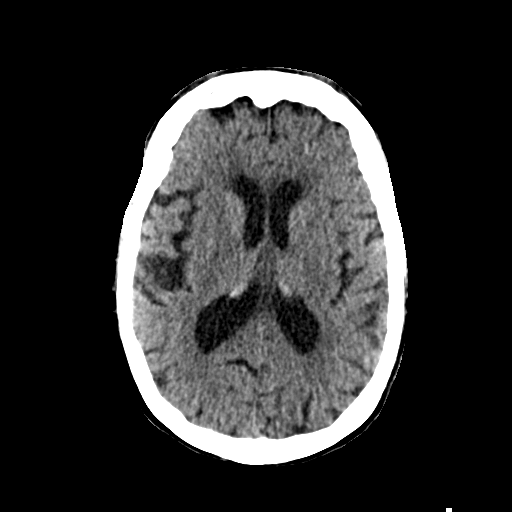
[im 35/67  bone]
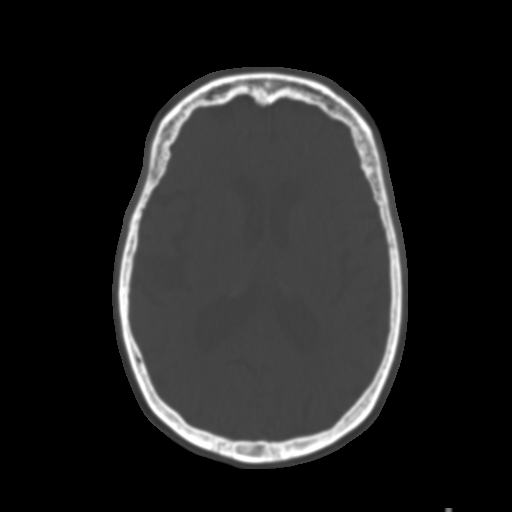
[im 39/67  brain]
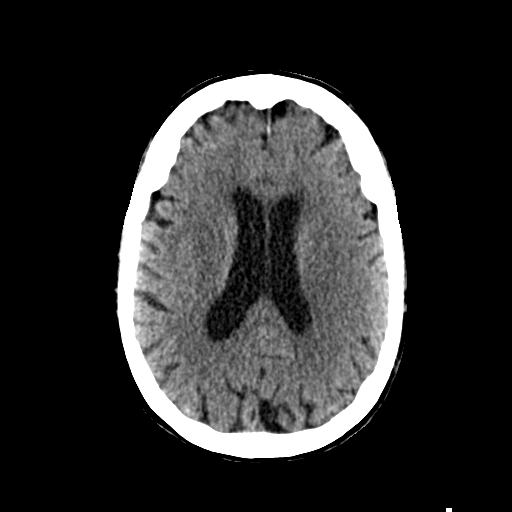
[im 44/67  brain]
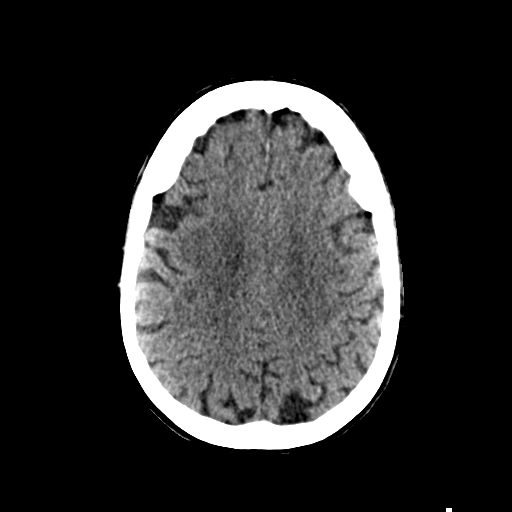
[im 48/67  brain]
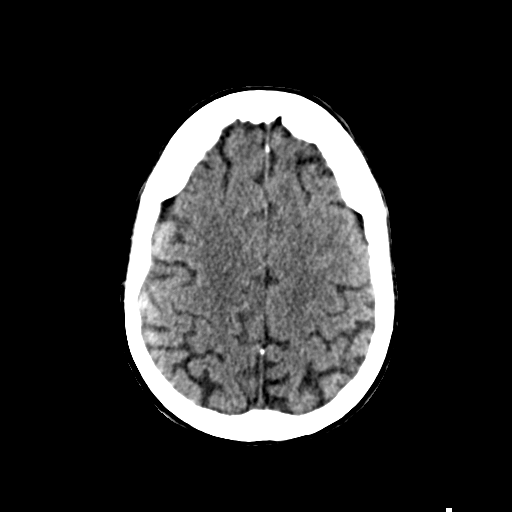
[im 51/67  brain]
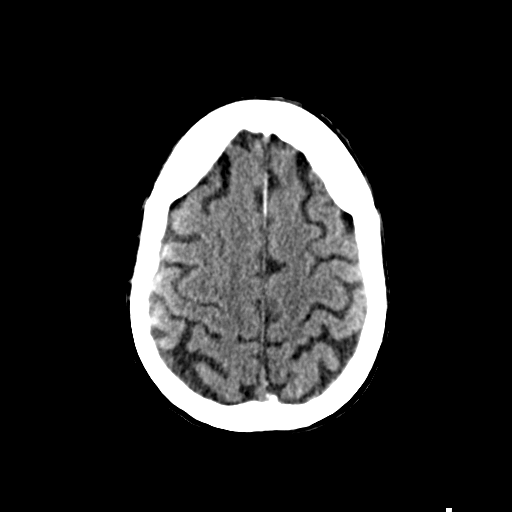
[im 51/67  bone]
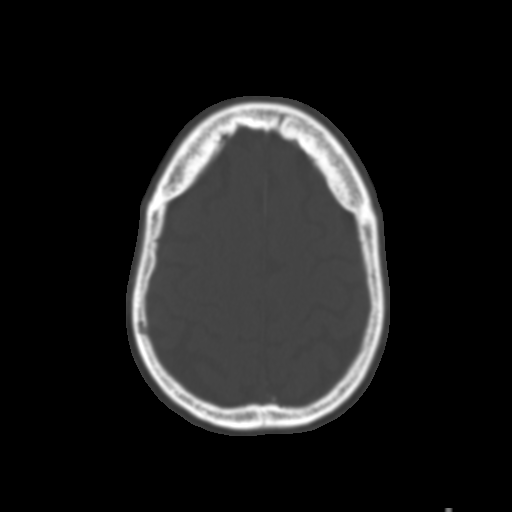
[im 55/67  brain]
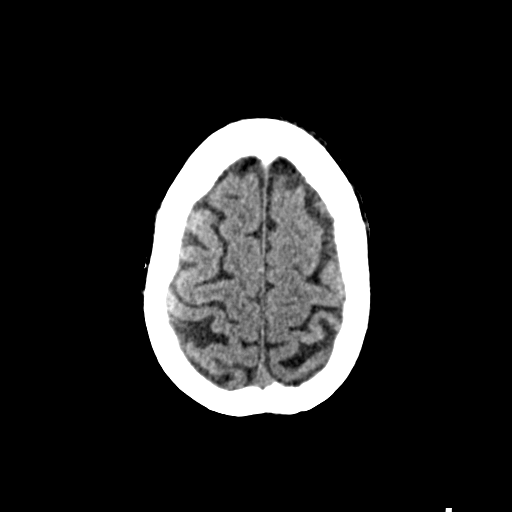
[im 60/67  brain]
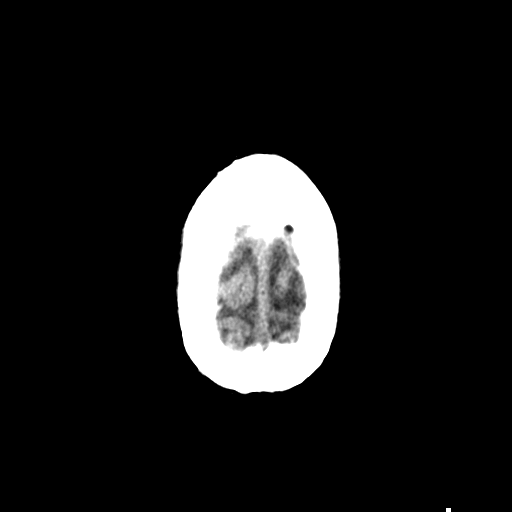
[im 64/67  brain]
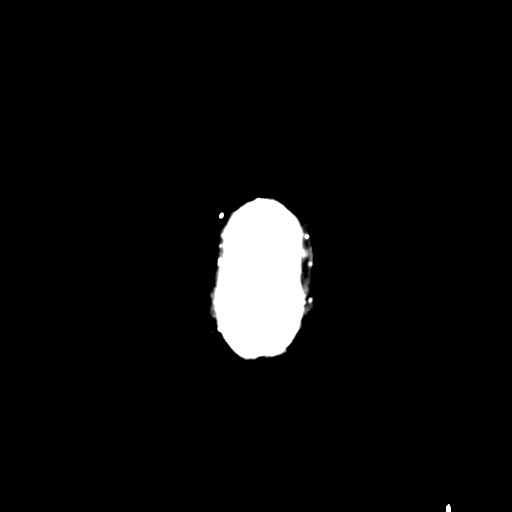

[16 of 30 positions shown; findings below may reference images not displayed]

FINDINGS: Sinuses/Soft tissues: Minimal left maxillary sinus mucosal
thickening. Other paranasal sinuses and mastoid air cells clear.

Intracranial: Mild low density in the periventricular white matter
likely related to small vessel disease. Expected cerebral volume
loss for age. Left vertebral carotid atherosclerosis. No mass
lesion, hemorrhage, hydrocephalus, acute infarct, intra-axial, or
extra-axial fluid collection.
IMPRESSION: 1.  No acute intracranial abnormality.
2. Mild small vessel ischemic change.
3. Minimal sinus disease.
